# Patient Record
Sex: Female | Born: 1960 | Race: White | Hispanic: No | Smoking: Current every day smoker
Health system: Southern US, Community
[De-identification: ages and names within clinical notes are randomized; demographics above are authoritative.]

## PROBLEM LIST (undated history)

## (undated) HISTORY — PX: TONSILLECTOMY: SUR1361

## (undated) HISTORY — PX: PARTIAL HYSTERECTOMY: SHX80

## (undated) HISTORY — PX: EYE SURGERY: SHX253

---

## 2008-05-01 ENCOUNTER — Emergency Department (HOSPITAL_COMMUNITY): Admission: EM | Admit: 2008-05-01 | Discharge: 2008-05-01 | Payer: Self-pay | Admitting: Family Medicine

## 2008-11-10 ENCOUNTER — Emergency Department (HOSPITAL_COMMUNITY): Admission: EM | Admit: 2008-11-10 | Discharge: 2008-11-10 | Payer: Self-pay | Admitting: Family Medicine

## 2009-10-03 ENCOUNTER — Emergency Department (HOSPITAL_COMMUNITY): Admission: EM | Admit: 2009-10-03 | Discharge: 2009-10-03 | Payer: Self-pay | Admitting: Emergency Medicine

## 2010-06-18 LAB — POCT URINALYSIS DIP (DEVICE)
Ketones, ur: NEGATIVE mg/dL
Nitrite: NEGATIVE
pH: 7 (ref 5.0–8.0)

## 2010-06-18 LAB — URINE CULTURE
Colony Count: NO GROWTH
Culture: NO GROWTH

## 2010-06-18 LAB — GC/CHLAMYDIA PROBE AMP, GENITAL
Chlamydia, DNA Probe: NEGATIVE
GC Probe Amp, Genital: NEGATIVE

## 2010-06-18 LAB — WET PREP, GENITAL: Clue Cells Wet Prep HPF POC: NONE SEEN

## 2010-06-28 LAB — POCT I-STAT, CHEM 8
BUN: 12 mg/dL (ref 6–23)
Calcium, Ion: 1.21 mmol/L (ref 1.12–1.32)
Chloride: 102 mEq/L (ref 96–112)
Creatinine, Ser: 0.6 mg/dL (ref 0.4–1.2)
Glucose, Bld: 98 mg/dL (ref 70–99)
Hemoglobin: 15 g/dL (ref 12.0–15.0)
Potassium: 4.3 mEq/L (ref 3.5–5.1)
TCO2: 28 mmol/L (ref 0–100)

## 2010-06-28 LAB — URINE CULTURE

## 2010-06-28 LAB — POCT URINALYSIS DIP (DEVICE)
Bilirubin Urine: NEGATIVE
Specific Gravity, Urine: 1.02 (ref 1.005–1.030)
Urobilinogen, UA: 0.2 mg/dL (ref 0.0–1.0)

## 2010-06-28 LAB — TSH: TSH: 1.63 u[IU]/mL (ref 0.350–4.500)

## 2010-06-28 LAB — GC/CHLAMYDIA PROBE AMP, GENITAL: Chlamydia, DNA Probe: NEGATIVE

## 2010-06-28 LAB — WET PREP, GENITAL: Trich, Wet Prep: NONE SEEN

## 2011-03-12 ENCOUNTER — Emergency Department (HOSPITAL_COMMUNITY)
Admission: EM | Admit: 2011-03-12 | Discharge: 2011-03-12 | Disposition: A | Discharge status: Home / Self Care | Payer: BC Managed Care – PPO | Attending: Emergency Medicine | Admitting: Emergency Medicine

## 2011-03-12 ENCOUNTER — Emergency Department (HOSPITAL_COMMUNITY): Payer: BC Managed Care – PPO

## 2011-03-12 ENCOUNTER — Encounter: Payer: Self-pay | Admitting: *Deleted

## 2011-03-12 DIAGNOSIS — Z9889 Other specified postprocedural states: Secondary | ICD-10-CM | POA: Insufficient documentation

## 2011-03-12 DIAGNOSIS — IMO0002 Reserved for concepts with insufficient information to code with codable children: Secondary | ICD-10-CM | POA: Insufficient documentation

## 2011-03-12 DIAGNOSIS — R109 Unspecified abdominal pain: Secondary | ICD-10-CM | POA: Insufficient documentation

## 2011-03-12 DIAGNOSIS — R10819 Abdominal tenderness, unspecified site: Secondary | ICD-10-CM | POA: Insufficient documentation

## 2011-03-12 DIAGNOSIS — N949 Unspecified condition associated with female genital organs and menstrual cycle: Secondary | ICD-10-CM | POA: Insufficient documentation

## 2011-03-12 DIAGNOSIS — F17200 Nicotine dependence, unspecified, uncomplicated: Secondary | ICD-10-CM | POA: Insufficient documentation

## 2011-03-12 LAB — CBC
Hemoglobin: 13.3 g/dL (ref 12.0–15.0)
MCH: 29.4 pg (ref 26.0–34.0)
MCHC: 33.7 g/dL (ref 30.0–36.0)
MCV: 87.2 fL (ref 78.0–100.0)
Platelets: 218 10*3/uL (ref 150–400)
RBC: 4.53 MIL/uL (ref 3.87–5.11)

## 2011-03-12 LAB — DIFFERENTIAL
Eosinophils Absolute: 0.3 10*3/uL (ref 0.0–0.7)
Monocytes Absolute: 0.6 10*3/uL (ref 0.1–1.0)

## 2011-03-12 LAB — URINE MICROSCOPIC-ADD ON

## 2011-03-12 LAB — URINALYSIS, ROUTINE W REFLEX MICROSCOPIC
Bilirubin Urine: NEGATIVE
Nitrite: POSITIVE — AB
Specific Gravity, Urine: 1.026 (ref 1.005–1.030)
pH: 6 (ref 5.0–8.0)

## 2011-03-12 LAB — BASIC METABOLIC PANEL
CO2: 27 mEq/L (ref 19–32)
Creatinine, Ser: 0.51 mg/dL (ref 0.50–1.10)
GFR calc Af Amer: >90 mL/min (ref >90)

## 2011-03-12 LAB — WET PREP, GENITAL: Clue Cells Wet Prep HPF POC: NONE SEEN

## 2011-03-12 NOTE — ED Notes (Signed)
Pt to ultrasound

## 2011-03-12 NOTE — ED Provider Notes (Signed)
History     CSN: [id number]  Arrival date & time 03/12/11  1450   First MD Initiated Contact with Patient 03/12/11 1537      Chief Complaint  Patient presents with  . Groin Pain    (Consider location/radiation/quality/duration/timing/severity/associated sxs/prior treatment) Patient is a 50 y.o. female presenting with groin pain. The history is provided by the patient.  Groin Pain Pertinent negatives include no chest pain, no abdominal pain, no headaches and no shortness of breath.   patient has had vaginal pain for last 2 years. Worse for the last 4 months. She states the pain is much worse with exertion. She states lubrication has not helped. She states she sometimes comes to tears with. She states she had a partial hysterectomy 20 years ago. She states her periods stopped about 2 years ago. Upon further questioning she's not sure if it was a partial hysterectomy her something to do with her tubes. No vaginal discharge. No nausea vomiting she states no abdominal pain. No weight loss.  History reviewed. No pertinent past medical history.  Past Surgical History  Procedure Date  . Partial hysterectomy   . Tonsillectomy   . Eye surgery   . Cesarean section x2    History reviewed. No pertinent family history.  History  Substance Use Topics  . Smoking status: Current Everyday Smoker -- 0.5 packs/day for 40 years    Types: Cigarettes  . Smokeless tobacco: Never Used  . Alcohol Use: No    OB History    Grav Para Term Preterm Abortions TAB SAB Ect Mult Living                  Review of Systems  Constitutional: Negative for activity change and appetite change.  HENT: Negative for neck stiffness.   Eyes: Negative for pain.  Respiratory: Negative for chest tightness and shortness of breath.   Cardiovascular: Negative for chest pain and leg swelling.  Gastrointestinal: Negative for nausea, vomiting, abdominal pain and diarrhea.  Genitourinary: Positive for vaginal pain and  pelvic pain. Negative for flank pain, vaginal bleeding and vaginal discharge.  Musculoskeletal: Negative for back pain.  Skin: Negative for rash.  Neurological: Negative for weakness, numbness and headaches.  Psychiatric/Behavioral: Negative for behavioral problems.    Allergies  Sulfa antibiotics  Home Medications  No current outpatient prescriptions on file.  BP 115/75  Pulse 74  Temp(Src) 98 F (36.7 C) (Oral)  Resp 18  Ht 5\' 8"  (1.727 m)  Wt 153 lb (69.4 kg)  BMI 23.26 kg/m2  SpO2 97%  Physical Exam  Nursing note and vitals reviewed. Constitutional: She is oriented to person, place, and time. She appears well-developed and well-nourished.  HENT:  Head: Normocephalic and atraumatic.  Eyes: EOM are normal. Pupils are equal, round, and reactive to light.  Neck: Normal range of motion. Neck supple.  Cardiovascular: Normal rate, regular rhythm and normal heart sounds.   No murmur heard. Pulmonary/Chest: Effort normal and breath sounds normal. No respiratory distress. She has no wheezes. She has no rales.  Abdominal: Soft. Bowel sounds are normal. She exhibits no distension and no mass. There is tenderness. There is no rebound and no guarding.       Suprapubic tenderness without clear mass.  Musculoskeletal: Normal range of motion.  Neurological: She is alert and oriented to person, place, and time. No cranial nerve deficit.  Skin: Skin is warm and dry.  Psychiatric: She has a normal mood and affect. Her speech is normal.  ED Course  Procedures (including critical care time)  Labs Reviewed  URINALYSIS, ROUTINE W REFLEX MICROSCOPIC - Abnormal; Notable for the following:    APPearance CLOUDY (*)    Hgb urine dipstick SMALL (*)    Nitrite POSITIVE (*)    All other components within normal limits  WET PREP, GENITAL - Abnormal; Notable for the following:    WBC, Wet Prep HPF POC FEW (*)    All other components within normal limits  URINE MICROSCOPIC-ADD ON - Abnormal;  Notable for the following:    Bacteria, UA MANY (*)    All other components within normal limits  CBC  DIFFERENTIAL  BASIC METABOLIC PANEL  GC/CHLAMYDIA PROBE AMP, GENITAL   US Transvaginal Non-ob  03/12/2011  *RADIOLOGY REPORT*  Clinical Data: Pelvic pain.  TRANSABDOMINAL AND TRANSVAGINAL ULTRASOUND OF PELVIS Technique:  Both transabdominal and transvaginal ultrasound examinations of the pelvis were performed. Transabdominal technique was performed for global imaging of the pelvis including uterus, ovaries, adnexal regions, and pelvic cul-de-sac.  Comparison: None.   It was necessary to proceed with endovaginal exam following the transabdominal exam to visualize the uterus and ovaries.  Findings:  Uterus: Uterus measures approximately 6.4 x 2.9 x 3.9 cm and has a normal appearance without evidence of mass.  Endometrium: Endometrium is homogeneous and normal in thickness, measuring 3 mm.  Right ovary:  The right ovary was difficult to visualize due to surrounding bowel gas.  A structure is measured which may represent a small right ovary, measuring 1.6 x 1.1 x 1.3 cm.  Left ovary: The left ovary was better visualized and is normal in appearance, measuring 2.2 x 1.2 x 1.6 cm.  Other findings: No free fluid identified.  IMPRESSION: Normal study. No evidence of pelvic mass or other significant abnormality.  Original Report Authenticated By: Reola Calkins, M.D.   US Pelvis Complete  03/12/2011  *RADIOLOGY REPORT*  Clinical Data: Pelvic pain.  TRANSABDOMINAL AND TRANSVAGINAL ULTRASOUND OF PELVIS Technique:  Both transabdominal and transvaginal ultrasound examinations of the pelvis were performed. Transabdominal technique was performed for global imaging of the pelvis including uterus, ovaries, adnexal regions, and pelvic cul-de-sac.  Comparison: None.   It was necessary to proceed with endovaginal exam following the transabdominal exam to visualize the uterus and ovaries.  Findings:  Uterus: Uterus  measures approximately 6.4 x 2.9 x 3.9 cm and has a normal appearance without evidence of mass.  Endometrium: Endometrium is homogeneous and normal in thickness, measuring 3 mm.  Right ovary:  The right ovary was difficult to visualize due to surrounding bowel gas.  A structure is measured which may represent a small right ovary, measuring 1.6 x 1.1 x 1.3 cm.  Left ovary: The left ovary was better visualized and is normal in appearance, measuring 2.2 x 1.2 x 1.6 cm.  Other findings: No free fluid identified.  IMPRESSION: Normal study. No evidence of pelvic mass or other significant abnormality.  Original Report Authenticated By: Reola Calkins, M.D.     1. Dyspareunia       MDM  Patient is in pain with sex where she last 4 months. No dysuria or discharge. She states she had a hysterectomy blood the history is somewhat questionable. Ultrasound was done and did show a uterus present. She has recently gone through menopause. Lab work is reassuring. She'll be discharged home with GYN followup.        Juliet Rude. Rubin Payor, MD 03/12/11 2035

## 2011-03-12 NOTE — ED Notes (Signed)
Pt c/o vagina/pelvic pain x 4 months.  States does not have PCP.

## 2011-03-12 NOTE — ED Notes (Signed)
Patient resting and watching TV with NAD at this time.  

## 2011-03-12 NOTE — ED Notes (Signed)
Patient states she started having pain in her vaginal area x 4 months ago and x2 days has started to have shooting pain in the vaginal area and abdominal tenderness and exhausted all the time and fells dizzy. Patient denies discharge, burning or pain when urinating. Patient states she has no interest or desire in sex because of the pain. Patient denies N/V or fever.

## 2011-03-12 NOTE — ED Notes (Signed)
Patient states she is having shooting pains in to pelvic area x 2 days. Patient denies discharge, burning or pain when urinating. Patient states since the shooting pains started x 2 days ago she has noted her abdomen fells extended.

## 2011-03-13 LAB — GC/CHLAMYDIA PROBE AMP, GENITAL: GC Probe Amp, Genital: NEGATIVE

## 2013-04-08 ENCOUNTER — Emergency Department (HOSPITAL_COMMUNITY)
Admission: EM | Admit: 2013-04-08 | Discharge: 2013-04-08 | Disposition: A | Discharge status: Home / Self Care | Payer: Self-pay | Attending: Emergency Medicine | Admitting: Emergency Medicine

## 2013-04-08 ENCOUNTER — Emergency Department (HOSPITAL_COMMUNITY): Payer: Self-pay

## 2013-04-08 ENCOUNTER — Encounter (HOSPITAL_COMMUNITY): Payer: Self-pay | Admitting: Emergency Medicine

## 2013-04-08 DIAGNOSIS — R51 Headache: Secondary | ICD-10-CM | POA: Insufficient documentation

## 2013-04-08 DIAGNOSIS — F17200 Nicotine dependence, unspecified, uncomplicated: Secondary | ICD-10-CM | POA: Insufficient documentation

## 2013-04-08 DIAGNOSIS — R350 Frequency of micturition: Secondary | ICD-10-CM | POA: Insufficient documentation

## 2013-04-08 DIAGNOSIS — J069 Acute upper respiratory infection, unspecified: Secondary | ICD-10-CM | POA: Insufficient documentation

## 2013-04-08 DIAGNOSIS — N390 Urinary tract infection, site not specified: Secondary | ICD-10-CM | POA: Insufficient documentation

## 2013-04-08 DIAGNOSIS — R55 Syncope and collapse: Secondary | ICD-10-CM | POA: Insufficient documentation

## 2013-04-08 LAB — BASIC METABOLIC PANEL
BUN: 8 mg/dL (ref 6–23)
CALCIUM: 9.1 mg/dL (ref 8.4–10.5)
CO2: 24 mEq/L (ref 19–32)
Chloride: 102 mEq/L (ref 96–112)
Creatinine, Ser: 0.46 mg/dL — ABNORMAL LOW (ref 0.50–1.10)
Glucose, Bld: 95 mg/dL (ref 70–99)
Potassium: 4.6 mEq/L (ref 3.7–5.3)
SODIUM: 139 meq/L (ref 137–147)

## 2013-04-08 LAB — URINALYSIS, ROUTINE W REFLEX MICROSCOPIC
Bilirubin Urine: NEGATIVE
Glucose, UA: NEGATIVE mg/dL
Hgb urine dipstick: NEGATIVE
Ketones, ur: NEGATIVE mg/dL
Nitrite: POSITIVE — AB
PROTEIN: NEGATIVE mg/dL
Specific Gravity, Urine: 1.009 (ref 1.005–1.030)
UROBILINOGEN UA: 1 mg/dL (ref 0.0–1.0)
pH: 7 (ref 5.0–8.0)

## 2013-04-08 LAB — CBC
HCT: 39.2 % (ref 36.0–46.0)
Hemoglobin: 13.2 g/dL (ref 12.0–15.0)
MCH: 29.4 pg (ref 26.0–34.0)
MCHC: 33.7 g/dL (ref 30.0–36.0)
MCV: 87.3 fL (ref 78.0–100.0)
Platelets: 248 10*3/uL (ref 150–400)
RBC: 4.49 MIL/uL (ref 3.87–5.11)
RDW: 13.4 % (ref 11.5–15.5)
WBC: 8.4 10*3/uL (ref 4.0–10.5)

## 2013-04-08 LAB — URINE MICROSCOPIC-ADD ON

## 2013-04-08 LAB — GLUCOSE, CAPILLARY: GLUCOSE-CAPILLARY: 91 mg/dL (ref 70–99)

## 2013-04-08 MED ORDER — SODIUM CHLORIDE 0.9 % IV BOLUS (SEPSIS)
1000.0000 mL | Freq: Once | INTRAVENOUS | Status: AC
  Administered 2013-04-08: 1000 mL via INTRAVENOUS

## 2013-04-08 MED ORDER — CEPHALEXIN 500 MG PO CAPS: 500.0000 mg | ORAL_CAPSULE | Freq: Two times a day (BID) | ORAL | Status: DC

## 2013-04-08 NOTE — ED Provider Notes (Signed)
CSN: [id number]     Arrival date & time 04/08/13  2008 History   First MD Initiated Contact with Patient 04/08/13 2019     Chief Complaint  Patient presents with  . Loss of Consciousness   (Consider location/radiation/quality/duration/timing/severity/associated sxs/prior Treatment) HPI Comments: 53 year old female presents after a syncope episode. Patient states that she has been ill the last couple days with headache, cough, chest congestion. She's not had "high fevers" or shortness of breath. No chest pain. Patient states his headache is throbbing headache at the top of her head. Has been waxing and waning there is always some level of the headache. She states is about 5 or 6/10. Is not come on suddenly and is not the worst headache of her life. No focal weakness or numbness. Patient states that she got off the couch when she woke up her nap and she felt extremely thirsty. She states she's never felt this thirsty. Patient then started to walk to the kitchen and had a syncope episode. She states she did not feel quite right when she got not but has not her feeling particularly lightheaded.. She denies any visual complaints. Her to getting another came home and found her on the floor and she quickly awoke. No shaking noted. Does not know how long she was passed out.    History reviewed. No pertinent past medical history. Past Surgical History  Procedure Laterality Date  . Partial hysterectomy    . Tonsillectomy    . Eye surgery    . Cesarean section  x2   History reviewed. No pertinent family history. History  Substance Use Topics  . Smoking status: Current Every Day Smoker -- 0.50 packs/day for 40 years    Types: Cigarettes  . Smokeless tobacco: Never Used  . Alcohol Use: No   OB History   Grav Para Term Preterm Abortions TAB SAB Ect Mult Living                 Review of Systems  Constitutional: Negative for fever.  HENT: Positive for congestion. Negative for sore throat.   Eyes:  Negative for visual disturbance.  Respiratory: Positive for cough. Negative for shortness of breath.   Cardiovascular: Positive for syncope. Negative for chest pain.  Gastrointestinal: Negative for vomiting, abdominal pain and diarrhea.  Genitourinary: Positive for urgency. Negative for dysuria and frequency.  Musculoskeletal: Negative for back pain and myalgias.  Neurological: Positive for syncope and headaches. Negative for dizziness and weakness.  All other systems reviewed and are negative.    Allergies  Sulfa antibiotics  Home Medications   Current Outpatient Rx  Name  Route  Sig  Dispense  Refill  . acetaminophen (TYLENOL) 325 MG tablet   Oral   Take 650 mg by mouth every 6 (six) hours as needed (pain).          BP 95/75  Temp(Src) 98.7 F (37.1 C) (Oral)  Resp 17  Ht 5\' 8"  (1.727 m)  Wt 163 lb (73.936 kg)  BMI 24.79 kg/m2  SpO2 98% Physical Exam  Nursing note and vitals reviewed. Constitutional: She is oriented to person, place, and time. She appears well-developed and well-nourished.  HENT:  Head: Normocephalic and atraumatic.  Right Ear: External ear normal.  Left Ear: External ear normal.  Nose: Nose normal.  Eyes: EOM are normal. Pupils are equal, round, and reactive to light. Right eye exhibits no discharge. Left eye exhibits no discharge.  Cardiovascular: Normal rate, regular rhythm and normal heart sounds.  No murmur heard. Pulmonary/Chest: Effort normal and breath sounds normal.  Abdominal: Soft. There is no tenderness.  Neurological: She is alert and oriented to person, place, and time. She has normal strength. No cranial nerve deficit or sensory deficit. GCS eye subscore is 4. GCS verbal subscore is 5. GCS motor subscore is 6.  Reflex Scores:      Bicep reflexes are 2+ on the right side and 2+ on the left side.      Patellar reflexes are 2+ on the right side and 2+ on the left side.      Achilles reflexes are 2+ on the right side and 2+ on the left  side. CN 2-12 grossly intact. 5/5 strength in all 4 extremities.  Skin: Skin is warm and dry.    ED Course  Procedures (including critical care time) Labs Review Labs Reviewed  BASIC METABOLIC PANEL - Abnormal; Notable for the following:    Creatinine, Ser 0.46 (*)    All other components within normal limits  URINALYSIS, ROUTINE W REFLEX MICROSCOPIC - Abnormal; Notable for the following:    APPearance CLOUDY (*)    Nitrite POSITIVE (*)    Leukocytes, UA TRACE (*)    All other components within normal limits  URINE MICROSCOPIC-ADD ON - Abnormal; Notable for the following:    Bacteria, UA MANY (*)    All other components within normal limits  GLUCOSE, CAPILLARY  CBC   Imaging Review Dg Chest 2 View  04/08/2013   CLINICAL DATA:  Syncope, cough, smoker  EXAM: CHEST  2 VIEW  COMPARISON:  None.  FINDINGS: The heart size and mediastinal contours are within normal limits. Both lungs are clear. The visualized skeletal structures are unremarkable.  IMPRESSION: No active cardiopulmonary disease.   Electronically Signed   By: Ruel Favorsrevor  Shick M.D.   On: 04/08/2013 22:14    EKG Interpretation    Date/Time:  Tuesday April 08 2013 20:10:12 EST Ventricular Rate:  92 PR Interval:  156 QRS Duration: 80 QT Interval:  343 QTC Calculation: 424 R Axis:   84 Text Interpretation:  Age not entered, assumed to be  53 years old for purpose of ECG interpretation Sinus rhythm Nonspecific T in AVL, artifact noted Otherwise normal ECG No old tracing to compare Confirmed by Irish Piech  MD, Addalynne Golding ([id number]) on 04/08/2013 9:01:26 PM            MDM   1. Syncope   2. URI (upper respiratory infection)   3. UTI (lower urinary tract infection)    Patient's respiratory symptoms are most likely a viral URI. No signs of PNA, no dyspnea. She has had a gradual waxing and waning headache with her URI, but with no neck stiffness, normal neuro exam and no acute worsening of HA I [facility] and feel  this is not the cause of her syncope. She relates she's had significantly decreased PO intake and her symptoms were recreated with orthostatics. She feels improved with fluids. I encouraged increased PO intake. Her EKG shows no worrisome cause for syncope. She does related to having some urinary urgency for the past 2 weeks w/o dysuria. Given the positive nitrites will treat as UTI with keflex. No signs of having pyelonephritis.     Audree CamelScott T Nirvaan Frett, MD 04/08/13 [phone]

## 2013-04-08 NOTE — ED Notes (Addendum)
Pt reports having flu like symptoms x3 days. Pt was found by boyfriend in kitchen floor passed out. Pt unable to remember what happened. Pt in floor for an estimated 1-2hrs. 12 lead unremarkable. Pt also reports feeling extremely thirsty prior to syncopal episode and has had little fluid intake over past couple of days.

## 2013-04-08 NOTE — Discharge Instructions (Signed)
°Emergency Department Resource Guide °1) Find a Doctor and Pay Out of Pocket °Although you won't have to find out who is covered by your insurance plan, it is a good idea to ask around and get recommendations. You will then need to call the office and see if the doctor you have chosen will accept you as a new patient and what types of options they offer for patients who are self-pay. Some doctors offer discounts or will set up payment plans for their patients who do not have insurance, but you will need to ask so you aren't surprised when you get to your appointment. ° °2) Contact Your Local Health Department °Not all health departments have doctors that can see patients for sick visits, but many do, so it is worth a call to see if yours does. If you don't know where your local health department is, you can check in your phone book. The CDC also has a tool to help you locate your state's health department, and many state websites also have listings of all of their local health departments. ° °3) Find a Walk-in Clinic °If your illness is not likely to be very severe or complicated, you may want to try a walk in clinic. These are popping up all over the country in pharmacies, drugstores, and shopping centers. They're usually staffed by nurse practitioners or physician assistants that have been trained to treat common illnesses and complaints. They're usually fairly quick and inexpensive. However, if you have serious medical issues or chronic medical problems, these are probably not your best option. ° °No Primary Care Doctor: °- Call [facility] Connect at  832-8000 - they can help you locate a primary care doctor that  accepts your insurance, provides certain services, etc. °- Physician Referral Service- 1-800-533-3463 ° °Chronic Pain Problems: °Organization         Address  Phone   Notes  ° [facility]  (336) 297-2271 Patients need to be referred by their primary care doctor.  ° °Medication  Assistance: °Organization         Address  Phone   Notes  °[place] [facility] [address] °[place], [place] [address] (336) 641-8030 --Must be a resident of [place] °-- Must have NO insurance coverage whatsoever (no Medicaid/ Medicare, etc.) °-- The pt. MUST have a primary care doctor that directs their care regularly and follows them in the community °  °[facility]  (866) 331-1348   °[employer]  (888) 892-1162   ° °Agencies that provide inexpensive medical care: °Organization         Address  Phone   Notes  °[facility]  (336) 832-8035   °[facility]    (336) 832-7272   °[facility] [address] °[place], [place] [address] (336) 832-4777   °[facility] [address], °[place] (336) 271-4999   °[facility]    (336) 373-0678   °[facility]    (336) 272-1050   °[facility] ° [address], [place] Phone:  (336) 832-4444, Fax:  (336) 832-4440 Hours of Operation:  9 am - 6 pm, M-F.  Also accepts Medicaid/Medicare and self-pay.  °[facility] ° [address], [place] Phone: (336) 832-3150, Fax: (336) 832-3151. Hours of Operation:  8:30 am - 5:30 pm, M-F.  Also accepts Medicaid and self-pay.  °[facility] [place] [address], [place] Phone: (336) 878-6027   °[facility] [address], [place], [place] (336)723-1848, Ext. 123 Mondays & Thursdays: 7-9 AM.  First 15 patients are seen on a first come, first serve basis. °  ° °Medicaid-accepting [place] Providers: ° °Organization         Address  Phone   Notes  °[facility] [address], [place] (336) 641-2100 Also accepts self-pay patients.  °[facility] [address], [place] ° (336) 856-9996   °[facility] [address] [place] [address], [place]  (336) 288-8857   °[facility] [address] [place] [address], [place] (336) 299-7000   °Veita Bland [address], [place]  ° (336) 373-1557 Only accepts [place] Access Medicaid patients after they have their name applied to their card.  ° °Self-Pay (no insurance) in [place]: ° °Organization         Address  Phone   Notes  °Sickle Cell Patients, [facility] [address], [place] (336) 832-1970   °[facility] [address], [place] (336) 832-4400   °[facility] [place] [facility] ° [address], [place] (336) 992-4800   °[facility]/Dr. Osei-Bonsu ° [address] [place] [address], [place] or [address], [place] (336) 841-8500 Phone number for both [place] and [place] locations is the same.  °[facility] [address], [place] (336) 299-0000   °[facility] [address] [place] [address], [place] or [address] (336) 852-7530 °(336) 878-2260   °[facility] [address], [place] (336) 350-1642, phone; (336) 294-5005, fax Sees patients 1st and 3rd Saturday of every month.  Must not qualify for public or private insurance (i.e. Medicaid, Medicare, [place] Health Choice, Veterans' Benefits) • Household income should be no more than 200% of the poverty level •The clinic cannot treat you if you are pregnant or think you are pregnant • Sexually transmitted diseases are not treated at the clinic.  ° ° °Dental Care: °Organization         Address  Phone  Notes  °[place] [facility] [address], [place] (336) 641-6152 Accepts children up to age 21 who are enrolled in Medicaid or [place] Health Choice; pregnant women with a Medicaid card; and children who have applied for Medicaid or [place] Health Choice, but were declined, whose parents can pay a reduced fee at time of service.  °[place]  [facility] [place]  [address], [place] (336) 641-7733 Accepts children up to age 21 who are enrolled in Medicaid or [place] Health Choice; pregnant women with a Medicaid card; and children who have applied for Medicaid or [place] Health Choice, but were declined, whose parents can pay a reduced fee at time of service.  °[facility] ° [address], [place] (336) 641-4533 Patients are seen by appointment only. Walk-ins are not accepted. [facility] will see patients 18 years of age and older. °Monday - Tuesday (8am-5pm) °Most Wednesdays (8:30-5pm) °$30 per visit, cash only  °[facility] ° [address], [place] (336) 641-4533 Patients are seen by appointment only. Walk-ins are not accepted. [facility] will see patients 18 years of age and older. °One   Wednesday Evening (Monthly: Volunteer Based).  $30 per visit, cash only  °[facility]  (919) 537-3737 for adults; Children under age 4, call Graduate Pediatric Dentistry at (919) [phone]. Children aged 4-14, please call (919) 537-3737 to request a pediatric application. ° Dental services are provided in all areas of dental care including fillings, crowns and bridges, complete and partial dentures, implants, gum treatment, root canals, and extractions. Preventive care is also provided. Treatment is provided to both adults and children. °Patients are selected via a lottery and there is often a waiting list. °  °[facility] [address], °[place] ° (336) 763-8833 www.drcivils.com °  °[facility] [address], [place], [place] (336)723-1848, Ext. 123 Second and Fourth Thursday of each month, opens at 6:30 AM; Clinic ends at 9 AM.  Patients are seen on a first-come first-served basis, and a limited number are seen during each clinic.  ° °[facility] ° [address], [place], [place] (336) 723-7904    Eligibility Requirements °You must have lived in [place], or [place] counties for at least the last three months. °  You cannot be eligible for state or federal sponsored healthcare insurance, including Veterans Administration, Medicaid, or Medicare. °  You generally cannot be eligible for healthcare insurance through your employer.  °  How to apply: °Eligibility screenings are held every Tuesday and Wednesday afternoon from 1:00 pm until 4:00 pm. You do not need an appointment for the interview!  °[facility] [address], [place], [place] 336-631-2330   °[facility]  336-342-8273   °[facility]  336-703-3100   °[facility]  336-570-6415   ° °Behavioral Health Resources in the Community: °Intensive Outpatient Programs °Organization         Address  Phone  Notes  °[facility] [address], [place], [place] 336-878-6098   °[facility] [address], [place], [place] 336-832-9800   °[facility] [address], [place], [place] ° 336-882-2125   °[facility] [address],  °[place], [place] 1-800-853-5163 or 336-641-4981   °Substance Abuse Resources °Organization         Address  Phone  Notes  °[facility]  336-882-2125   °[facility]  336-784-9470   °[facility]  336-285-9073   °[facility]  336-845-3988   °Residential & Outpatient Substance Abuse Program  1-800-659-3381   °Psychological Services °Organization         Address  Phone  Notes  °[facility]  336- 832-9600   °[facility]  336- 378-7881   °[facility] [address], [place] 1-800-853-5163 or 336-641-4981   ° °Mobile Crisis Teams °Organization         Address  Phone  Notes  °[facility], Mobile Crisis Care Unit  1-877-626-1772   °Assertive °[facility] ° [address]  [place], [place] 336-834-9664   °Sharon DeEsch [address] °[place] 336-554-5454   ° °Self-Help/Support Groups °Organization         Address  Phone             Notes  °[facility]  - variety of support groups  336- 373-1402 Call for more information  °Narcotics Anonymous (NA), [facility] [address], °[place]  2 meetings at this location  ° °  Residential Treatment Programs °Organization         Address  Phone  Notes  °[facility] [address],    °[place]  1-866-801-8205   °[facility] ° [address], [place], [place] 704-293-8524   °[facility] [address], [place] 336-845-3988 Admissions: 8am-3pm M-F  °[facility] [address]    °[place], [place] 336-841-1104   °[facility] [address], [place], [place] 336-379-7146   °[facility] [address]  °[place], [place] 336-285-9073   °[facility] - Intensive Outpatient [address], [place], [place] 336-852-3033   °[facility] [address]  °[place], [place] 1-877-615-2722 or 336-784-9470   °[facility] ([facility] [address] , [place] 336-227-7417 Accepts Medicaid  °[facility] [address]  °[place] 1-800-659-3381 Substance Abuse/Addiction Treatment  ° °[place] Behavioral Health Resources °Organization         Address  Phone  Notes  °[facility]  (888) 581-9988   °Julie Brannon, PhD [address], [place]   (336) 349-5553 or (336) 951-0000   °[facility]   [address] °[place], [place] (336) 349-4454   °[facility] [address], [place], [place] (336) 342-8316 Insurance/Medicaid/sponsorship through [facility]  °[facility] [address]                                    [place], [place] (336) 342-8316 Therapy/tele-psych/case    °[facility] [address]  ° [place], [place] (336) 349-2233    °Dr. Arfeen  (336) 349-4544   °[facility] [place]  [employer] [place] [facility] 1) [address], [place] °2) [address], [place] °3)  [address], [place] (336) 349-3220 °(336) 342-7768 ° °(336) 342-8140   °[place] [facility] (336) 342-1394 or (336) 342-3537 (After Hours)    ° ° °

## 2013-04-08 NOTE — ED Notes (Signed)
Bed: [id number] Expected date:  Expected time:  Means of arrival:  Comments: EMS 53yo syncope, flu like sx

## 2013-04-09 NOTE — Progress Notes (Signed)
   CARE MANAGEMENT ED NOTE 04/09/2013  Patient:  Jennifer Wallace,Jennifer Wallace   Account Number:  [id number]  Date Initiated:  04/09/2013  Documentation initiated by:  Radford PaxFERRERO,Malcome Ambrocio  Subjective/Objective Assessment:   Patient presents to Ed with syncopal episode.     Subjective/Objective Assessment Detail:     Action/Plan:   Action/Plan Detail:   Anticipated DC Date:       Status Recommendation to Physician:   Result of Recommendation:    Other ED Services  Consult Working Plan    DC Planning Services  Other  PCP issues    Choice offered to / List presented to:            Status of service:  Completed, signed off  ED Comments:   ED Comments Detail:  EDCM spoke to patient and her spouse at bedside.  As per patient, she is currently applying for [facility].  [place].  [facility] provided [facility] with a list of pcps who accept self pay patients, list of discount pharmacies and website needymeds.org for [facility], list of financial resources in the community such as local churches and salvation army, urban ministries, information regarding Medicaid and [employer]ordable Care Act, dental assistance for uninsured patients, and phone to inquire about the orange card.  Patient agreebale to receive letter about orange card services in the mail.  Patient thankful for services.  No further CM needs at this time.

## 2013-04-10 LAB — URINE CULTURE: Colony Count: >=100000

## 2013-04-11 NOTE — ED Notes (Signed)
+   Urine Patient treated with Cephalexin-sensitive to same-chart appended per protocol MD,

## 2013-10-27 ENCOUNTER — Emergency Department (HOSPITAL_COMMUNITY)
Admission: EM | Admit: 2013-10-27 | Discharge: 2013-10-27 | Discharge status: Against Medical Advice | Payer: BC Managed Care – PPO | Attending: Emergency Medicine | Admitting: Emergency Medicine

## 2013-10-27 ENCOUNTER — Encounter (HOSPITAL_COMMUNITY): Payer: Self-pay | Admitting: Emergency Medicine

## 2013-10-27 DIAGNOSIS — R1031 Right lower quadrant pain: Secondary | ICD-10-CM | POA: Insufficient documentation

## 2013-10-27 DIAGNOSIS — R110 Nausea: Secondary | ICD-10-CM | POA: Insufficient documentation

## 2013-10-27 DIAGNOSIS — F17200 Nicotine dependence, unspecified, uncomplicated: Secondary | ICD-10-CM | POA: Insufficient documentation

## 2013-10-27 NOTE — ED Notes (Signed)
Pt told registration that she was leaving.  

## 2013-10-27 NOTE — ED Notes (Signed)
Pt reports abd pain today, more severe RLQ and radiates into mid abd. Having nausea but denies any vomiting, diarrhea, vaginal or urinary symptoms.

## 2014-08-10 ENCOUNTER — Emergency Department (HOSPITAL_COMMUNITY): Payer: Self-pay

## 2014-08-10 ENCOUNTER — Emergency Department (HOSPITAL_COMMUNITY)
Admission: EM | Admit: 2014-08-10 | Discharge: 2014-08-10 | Disposition: A | Discharge status: Home / Self Care | Payer: Self-pay | Attending: Emergency Medicine | Admitting: Emergency Medicine

## 2014-08-10 ENCOUNTER — Encounter (HOSPITAL_COMMUNITY): Payer: Self-pay | Admitting: Emergency Medicine

## 2014-08-10 DIAGNOSIS — J441 Chronic obstructive pulmonary disease with (acute) exacerbation: Secondary | ICD-10-CM | POA: Insufficient documentation

## 2014-08-10 DIAGNOSIS — Z720 Tobacco use: Secondary | ICD-10-CM | POA: Insufficient documentation

## 2014-08-10 LAB — BASIC METABOLIC PANEL
Anion gap: 5 (ref 5–15)
BUN: 8 mg/dL (ref 6–20)
CALCIUM: 9.1 mg/dL (ref 8.9–10.3)
CO2: 27 mmol/L (ref 22–32)
Chloride: 108 mmol/L (ref 101–111)
Creatinine, Ser: 0.73 mg/dL (ref 0.44–1.00)
GFR calc Af Amer: >60 mL/min (ref >60)
GFR calc non Af Amer: >60 mL/min (ref >60)
Glucose, Bld: 104 mg/dL — ABNORMAL HIGH (ref 65–99)
Potassium: 5 mmol/L (ref 3.5–5.1)
Sodium: 140 mmol/L (ref 135–145)

## 2014-08-10 LAB — CBC WITH DIFFERENTIAL/PLATELET
BASOS PCT: 1 % (ref 0–1)
Basophils Absolute: 0 10*3/uL (ref 0.0–0.1)
EOS ABS: 0.2 10*3/uL (ref 0.0–0.7)
Eosinophils Relative: 3 % (ref 0–5)
HEMATOCRIT: 42.3 % (ref 36.0–46.0)
Hemoglobin: 13.9 g/dL (ref 12.0–15.0)
LYMPHS ABS: 1.9 10*3/uL (ref 0.7–4.0)
LYMPHS PCT: 34 % (ref 12–46)
MCH: 29 pg (ref 26.0–34.0)
MCHC: 32.9 g/dL (ref 30.0–36.0)
MCV: 88.1 fL (ref 78.0–100.0)
MONOS PCT: 11 % (ref 3–12)
Monocytes Absolute: 0.6 10*3/uL (ref 0.1–1.0)
Neutro Abs: 2.9 10*3/uL (ref 1.7–7.7)
Neutrophils Relative %: 51 % (ref 43–77)
Platelets: 218 10*3/uL (ref 150–400)
RBC: 4.8 MIL/uL (ref 3.87–5.11)
RDW: 14.2 % (ref 11.5–15.5)
WBC: 5.7 10*3/uL (ref 4.0–10.5)

## 2014-08-10 MED ORDER — AEROCHAMBER Z-STAT PLUS/MEDIUM MISC
1.0000 | Freq: Once | Status: AC
  Filled 2014-08-10: qty 1

## 2014-08-10 MED ORDER — PREDNISONE 20 MG PO TABS: 20.0000 mg | ORAL_TABLET | Freq: Two times a day (BID) | ORAL | Status: DC

## 2014-08-10 MED ORDER — ALBUTEROL SULFATE HFA 108 (90 BASE) MCG/ACT IN AERS
2.0000 | INHALATION_SPRAY | Freq: Once | RESPIRATORY_TRACT | Status: AC
  Administered 2014-08-10: 2 via RESPIRATORY_TRACT
  Filled 2014-08-10: qty 6.7

## 2014-08-10 MED ORDER — AEROCHAMBER PLUS FLO-VU SMALL MISC
1.0000 | Freq: Once | Status: AC
  Administered 2014-08-10: 1
  Filled 2014-08-10: qty 1

## 2014-08-10 MED ORDER — PREDNISONE 20 MG PO TABS
60.0000 mg | ORAL_TABLET | Freq: Once | ORAL | Status: AC
  Administered 2014-08-10: 60 mg via ORAL
  Filled 2014-08-10: qty 3

## 2014-08-10 MED ORDER — ALBUTEROL (5 MG/ML) CONTINUOUS INHALATION SOLN
10.0000 mg/h | INHALATION_SOLUTION | Freq: Once | RESPIRATORY_TRACT | Status: AC
  Administered 2014-08-10: 10 mg/h via RESPIRATORY_TRACT
  Filled 2014-08-10: qty 20

## 2014-08-10 MED ORDER — ALBUTEROL SULFATE HFA 108 (90 BASE) MCG/ACT IN AERS
2.0000 | INHALATION_SPRAY | Freq: Once | RESPIRATORY_TRACT | Status: AC
Start: 2014-08-10 — End: 2014-08-10

## 2014-08-10 NOTE — ED Notes (Signed)
Patient coming from home with c/o of SOB and cough ongoing since Saturday morning.  The cough is a dry, nonproductive cough.

## 2014-08-10 NOTE — ED Provider Notes (Signed)
CSN: [id number]     Arrival date & time 08/10/14  0704 History   First MD Initiated Contact with Patient 08/10/14 [phone]     Chief Complaint  Patient presents with  . Cough  . Shortness of Breath     (Consider location/radiation/quality/duration/timing/severity/associated sxs/prior Treatment) HPI   Jennifer Wallace is a 54 y.o. female who presents for evaluation of cough, nonproductive, shortness of breath, and occasional episodes of weakness. Symptom is ongoing for several months and worsening over the last several days. She is currently smoking, and has a long-time history of tobacco abuse. No chronic respiratory difficulty that is known, or previous similar problems. She denies chest pain, nausea, vomiting, abdominal pain or back pain. There are no other known modifying factors.  History reviewed. No pertinent past medical history. Past Surgical History  Procedure Laterality Date  . Partial hysterectomy    . Tonsillectomy    . Eye surgery    . Cesarean section  x2   No family history on file. History  Substance Use Topics  . Smoking status: Current Every Day Smoker -- 0.50 packs/day for 40 years    Types: Cigarettes  . Smokeless tobacco: Never Used  . Alcohol Use: No   OB History    No data available     Review of Systems  All other systems reviewed and are negative.     Allergies  Sulfa antibiotics  Home Medications   Prior to Admission medications   Medication Sig Start Date End Date Taking? Authorizing Provider  acetaminophen (TYLENOL) 325 MG tablet Take 650 mg by mouth every 6 (six) hours as needed (pain).   Yes Historical Provider, MD  predniSONE (DELTASONE) 20 MG tablet Take 1 tablet (20 mg total) by mouth 2 (two) times daily. 08/10/14   Mancel BaleElliott Pearlie Lafosse, MD   BP 117/60 mmHg  Pulse 92  Temp(Src) 98.3 F (36.8 C) (Oral)  Resp 22  Ht 5\' 8"  (1.727 m)  Wt 181 lb (82.101 kg)  BMI 27.53 kg/m2  SpO2 93% Physical Exam  Constitutional: She is oriented to person,  place, and time. She appears well-developed and well-nourished.  HENT:  Head: Normocephalic and atraumatic.  Right Ear: External ear normal.  Left Ear: External ear normal.  Eyes: Conjunctivae and EOM are normal. Pupils are equal, round, and reactive to light.  Neck: Normal range of motion and phonation normal. Neck supple.  Cardiovascular: Normal rate, regular rhythm and normal heart sounds.   Pulmonary/Chest: Effort normal and breath sounds normal. No respiratory distress. She exhibits no bony tenderness.  Decreased air movement bilaterally with generalized rhonchi and wheezing  Abdominal: Soft. There is no tenderness.  Musculoskeletal: Normal range of motion.  Neurological: She is alert and oriented to person, place, and time. No cranial nerve deficit or sensory deficit. She exhibits normal muscle tone. Coordination normal.  Skin: Skin is warm, dry and intact.  Psychiatric: She has a normal mood and affect. Her behavior is normal. Judgment and thought content normal.  Nursing note and vitals reviewed.   ED Course  Procedures (including critical care time)  Medications  predniSONE (DELTASONE) tablet 60 mg (60 mg Oral Given 08/10/14 0808)  albuterol (PROVENTIL,VENTOLIN) solution continuous neb (10 mg/hr Nebulization Given 08/10/14 0741)  albuterol (PROVENTIL HFA;VENTOLIN HFA) 108 (90 BASE) MCG/ACT inhaler 2 puff (2 puffs Inhalation Given 08/10/14 1052)  AEROCHAMBER PLUS FLO-VU SMALL device MISC 1 each (1 each Other Given 08/10/14 1052)  aerochamber Z-Stat Plus/medium 1 each (0 each Other Duplicate 08/10/14 1052)  albuterol (PROVENTIL HFA;VENTOLIN HFA) 108 (90 BASE) MCG/ACT inhaler 2 puff (0 puffs Inhalation Duplicate 08/10/14 1054)    Patient Vitals for the past 24 hrs:  BP Temp Temp src Pulse Resp SpO2 Height Weight  08/10/14 1100 117/60 mmHg - - 92 - 93 % - -  08/10/14 1054 - - - - - 98 % - -  08/10/14 1030 (!) 109/52 mmHg - - 84 22 - - -  08/10/14 1015 105/55 mmHg - - 87 25 92 % - -   08/10/14 0945 103/74 mmHg - - 90 25 94 % - -  08/10/14 0939 125/55 mmHg 98.3 F (36.8 C) Oral 99 25 99 % - -  08/10/14 0845 115/57 mmHg - - 88 21 98 % - -  08/10/14 0830 118/61 mmHg - - 82 23 96 % - -  08/10/14 0815 118/63 mmHg - - 81 17 97 % - -  08/10/14 0800 114/64 mmHg - - 73 14 96 % - -  08/10/14 0745 122/69 mmHg - - 79 21 96 % - -  08/10/14 0743 - - - - - 98 % - -  08/10/14 0730 121/70 mmHg - - 90 22 99 % - -  08/10/14 0720 140/88 mmHg 98.1 F (36.7 C) Oral 80 21 97 %  (1.727 m) 181 lb (82.101 kg)    10:34 AM Reevaluation with update and discussion. After initial assessment and treatment, an updated evaluation reveals she feels better after this treatment. Findings discussed with patient and significant other, all questions answered. She was encouraged to stop smoking. Asaiah Hunnicutt L    Labs Review Labs Reviewed  BASIC METABOLIC PANEL - Abnormal; Notable for the following:    Glucose, Bld 104 (*)    All other components within normal limits  CBC WITH DIFFERENTIAL/PLATELET    Imaging Review Dg Chest 2 View  08/10/2014   CLINICAL DATA:  Shortness of breath, cough starting Saturday  EXAM: CHEST  2 VIEW  COMPARISON:  04/08/2013  FINDINGS: Cardiomediastinal silhouette is stable. No acute infiltrate or pleural effusion. No pulmonary edema. Bony thorax is unremarkable.  IMPRESSION: No active cardiopulmonary disease.   Electronically Signed   By: Natasha Mead M.D.   On: 08/10/2014 09:04     EKG Interpretation None      MDM   Final diagnoses:  COPD exacerbation  Tobacco abuse    Evaluation is consistent with COPD exacerbation. No evidence for pneumonia. Doubt serous bacterial infection, metabolic instability or impending vascular collapse.  Nursing Notes Reviewed/ Care Coordinated Applicable Imaging Reviewed Interpretation of Laboratory Data incorporated into ED treatment  The patient appears reasonably screened and/or stabilized for discharge and I doubt any other  medical condition or other [facility] requiring further screening, evaluation, or treatment in the ED at this time prior to discharge.  Plan: Home Medications- Albuterol, Prednisone; Home Treatments- rest; return here if the recommended treatment, does not improve the symptoms; Recommended follow up- PCP prn     Mancel Bale, MD 08/10/14 [phone]

## 2014-08-10 NOTE — ED Notes (Signed)
Patient ambulated in the hall way with stable ambulation, maintaining 100% and a pulse between 100 - 110.

## 2014-08-10 NOTE — Discharge Instructions (Signed)
Use inhaler 2 puffs every 4 hours as needed for cough or trouble breathing Start the prednisone prescription, tomorrow Try to stop smoking. Follow-up with a primary care doctor for a checkup in one or 2 weeks.     Chronic Obstructive Pulmonary Disease Exacerbation Chronic obstructive pulmonary disease (COPD) is a common lung condition in which airflow from the lungs is limited. COPD is a general term that can be used to describe many different lung problems that limit airflow, including chronic bronchitis and emphysema. COPD exacerbations are episodes when breathing symptoms become much worse and require extra treatment. Without treatment, COPD exacerbations can be life threatening, and frequent COPD exacerbations can cause further damage to your lungs. CAUSES   Respiratory infections.   Exposure to smoke.   Exposure to air pollution, chemical fumes, or dust. Sometimes there is no apparent cause or trigger. RISK FACTORS  Smoking cigarettes.  Older age.  Frequent prior COPD exacerbations. SIGNS AND SYMPTOMS   Increased coughing.   Increased thick spit (sputum) production.   Increased wheezing.   Increased shortness of breath.   Rapid breathing.   Chest tightness. DIAGNOSIS  Your medical history, a physical exam, and tests will help your health care provider make a diagnosis. Tests may include:  A chest X-ray.  Basic lab tests.  Sputum testing.  An arterial blood gas test. TREATMENT  Depending on the severity of your COPD exacerbation, you may need to be admitted to a hospital for treatment. Some of the treatments commonly used to treat COPD exacerbations are:   Antibiotic medicines.   Bronchodilators. These are drugs that expand the air passages. They may be given with an inhaler or nebulizer. Spacer devices may be needed to help improve drug delivery.  Corticosteroid medicines.  Supplemental oxygen therapy.  HOME CARE INSTRUCTIONS   Do not smoke.  Quitting smoking is very important to prevent COPD from getting worse and exacerbations from happening as often.  Avoid exposure to all substances that irritate the airway, especially to tobacco smoke.   If you were prescribed an antibiotic medicine, finish it all even if you start to feel better.  Take all medicines as directed by your health care provider.It is important to use correct technique with inhaled medicines.  Drink enough fluids to keep your urine clear or pale yellow (unless you have a medical condition that requires fluid restriction).  Use a cool mist vaporizer. This makes it easier to clear your chest when you cough.   If you have a home nebulizer and oxygen, continue to use them as directed.   Maintain all necessary vaccinations to prevent infections.   Exercise regularly.   Eat a healthy diet.   Keep all follow-up appointments as directed by your health care provider. SEEK IMMEDIATE MEDICAL CARE IF:  You have worsening shortness of breath.   You have trouble talking.   You have severe chest pain.  You have blood in your sputum.  You have a fever.  You have weakness, vomit repeatedly, or faint.   You feel confused.   You continue to get worse. MAKE SURE YOU:   Understand these instructions.  Will watch your condition.  Will get help right away if you are not doing well or get worse. Document Released: 12/25/2006 Document Revised: 07/14/2013 Document Reviewed: 11/01/2012 [facility] Patient Information 2015 [place]. This information is not intended to replace advice given to you by your health care provider. Make sure you discuss any questions you have with your health care  provider.  Smoking Cessation Quitting smoking is important to your health and has many advantages. However, it is not always easy to quit since nicotine is a very addictive drug. Oftentimes, people try 3 times or more before being able to quit. This document  explains the best ways for you to prepare to quit smoking. Quitting takes hard work and a lot of effort, but you can do it. ADVANTAGES OF QUITTING SMOKING  You will live longer, feel better, and live better.  Your body will feel the impact of quitting smoking almost immediately.  Within 20 minutes, blood pressure decreases. Your pulse returns to its normal level.  After 8 hours, carbon monoxide levels in the blood return to normal. Your oxygen level increases.  After 24 hours, the chance of having a heart attack starts to decrease. Your breath, hair, and body stop smelling like smoke.  After 48 hours, damaged nerve endings begin to recover. Your sense of taste and smell improve.  After 72 hours, the body is virtually free of nicotine. Your bronchial tubes relax and breathing becomes easier.  After 2 to 12 weeks, lungs can hold more air. Exercise becomes easier and circulation improves.  The risk of having a heart attack, stroke, cancer, or lung disease is greatly reduced.  After 1 year, the risk of coronary heart disease is cut in half.  After 5 years, the risk of stroke falls to the same as a nonsmoker.  After 10 years, the risk of lung cancer is cut in half and the risk of other cancers decreases significantly.  After 15 years, the risk of coronary heart disease drops, usually to the level of a nonsmoker.  If you are pregnant, quitting smoking will improve your chances of having a healthy baby.  The people you live with, especially any children, will be healthier.  You will have extra money to spend on things other than cigarettes. QUESTIONS TO THINK ABOUT BEFORE ATTEMPTING TO QUIT You may want to talk about your answers with your health care provider.  Why do you want to quit?  If you tried to quit in the past, what helped and what did not?  What will be the most difficult situations for you after you quit? How will you plan to handle them?  Who can help you through the  tough times? Your family? Friends? A health care provider?  What pleasures do you get from smoking? What ways can you still get pleasure if you quit? Here are some questions to ask your health care provider:  How can you help me to be successful at quitting?  What medicine do you think would be best for me and how should I take it?  What should I do if I need more help?  What is smoking withdrawal like? How can I get information on withdrawal? GET READY  Set a quit date.  Change your environment by getting rid of all cigarettes, ashtrays, matches, and lighters in your home, car, or work. Do not let people smoke in your home.  Review your past attempts to quit. Think about what worked and what did not. GET SUPPORT AND ENCOURAGEMENT You have a better chance of being successful if you have help. You can get support in many ways.  Tell your family, friends, and coworkers that you are going to quit and need their support. Ask them not to smoke around you.  Get individual, group, or telephone counseling and support. Programs are available at [employer] and  health centers. Call your local health department for information about programs in your area.  Spiritual beliefs and practices may help some smokers quit.  Download a "quit meter" on your computer to keep track of quit statistics, such as how long you have gone without smoking, cigarettes not smoked, and money saved.  Get a self-help book about quitting smoking and staying off tobacco. LEARN NEW SKILLS AND BEHAVIORS  Distract yourself from urges to smoke. Talk to someone, go for a walk, or occupy your time with a task.  Change your normal routine. Take a different route to work. Drink tea instead of coffee. Eat breakfast in a different place.  Reduce your stress. Take a hot bath, exercise, or read a book.  Plan something enjoyable to do every day. Reward yourself for not smoking.  Explore interactive web-based programs that  specialize in helping you quit. GET MEDICINE AND USE IT CORRECTLY Medicines can help you stop smoking and decrease the urge to smoke. Combining medicine with the above behavioral methods and support can greatly increase your chances of successfully quitting smoking.  Nicotine replacement therapy helps deliver nicotine to your body without the negative effects and risks of smoking. Nicotine replacement therapy includes nicotine gum, lozenges, inhalers, nasal sprays, and skin patches. Some may be available over-the-counter and others require a prescription.  Antidepressant medicine helps people abstain from smoking, but how this works is unknown. This medicine is available by prescription.  Nicotinic receptor partial agonist medicine simulates the effect of nicotine in your brain. This medicine is available by prescription. Ask your health care provider for advice about which medicines to use and how to use them based on your health history. Your health care provider will tell you what side effects to look out for if you choose to be on a medicine or therapy. Carefully read the information on the package. Do not use any other product containing nicotine while using a nicotine replacement product.  RELAPSE OR DIFFICULT SITUATIONS Most relapses occur within the first 3 months after quitting. Do not be discouraged if you start smoking again. Remember, most people try several times before finally quitting. You may have symptoms of withdrawal because your body is used to nicotine. You may crave cigarettes, be irritable, feel very hungry, cough often, get headaches, or have difficulty concentrating. The withdrawal symptoms are only temporary. They are strongest when you first quit, but they will go away within 10-14 days. To reduce the chances of relapse, try to:  Avoid drinking alcohol. Drinking lowers your chances of successfully quitting.  Reduce the amount of caffeine you consume. Once you quit smoking,  the amount of caffeine in your body increases and can give you symptoms, such as a rapid heartbeat, sweating, and anxiety.  Avoid smokers because they can make you want to smoke.  Do not let weight gain distract you. Many smokers will gain weight when they quit, usually less than 10 pounds. Eat a healthy diet and stay active. You can always lose the weight gained after you quit.  Find ways to improve your mood other than smoking. FOR MORE INFORMATION  www.smokefree.gov  Document Released: 02/21/2001 Document Revised: 07/14/2013 Document Reviewed: 06/08/2011 [facility] Patient Information 2015 [place], [place]. This information is not intended to replace advice given to you by your health care provider. Make sure you discuss any questions you have with your health care provider.  Emergency Department Resource Guide 1) Find a Doctor and Pay Out of Pocket Although you won't have to  find out who is covered by your insurance plan, it is a good idea to ask around and get recommendations. You will then need to call the office and see if the doctor you have chosen will accept you as a new patient and what types of options they offer for patients who are self-pay. Some doctors offer discounts or will set up payment plans for their patients who do not have insurance, but you will need to ask so you aren't surprised when you get to your appointment.  2) Contact Your Local Health Department Not all health departments have doctors that can see patients for sick visits, but many do, so it is worth a call to see if yours does. If you don't know where your local health department is, you can check in your phone book. The CDC also has a tool to help you locate your state's health department, and many state websites also have listings of all of their local health departments.  3) Find a Walk-in Clinic If your illness is not likely to be very severe or complicated, you may want to try a walk in clinic. These are  popping up all over the country in pharmacies, drugstores, and shopping centers. They're usually staffed by nurse practitioners or physician assistants that have been trained to treat common illnesses and complaints. They're usually fairly quick and inexpensive. However, if you have serious medical issues or chronic medical problems, these are probably not your best option.  No Primary Care Doctor: - Call Health Connect at  [phone] - they can help you locate a primary care doctor that  accepts your insurance, provides certain services, etc. - Physician Referral Service- [phone]  Chronic Pain Problems: Organization         Address  Phone   Notes  [facility]  [phone] Patients need to be referred by their primary care doctor.   Medication Assistance: Organization         Address  Phone   Notes  [facility] [address] [place] [place], [place] [address] [phone] --Must be a resident of [facility] -- Must have NO insurance coverage whatsoever (no Medicaid/ Medicare, etc.) -- The pt. MUST have a primary care doctor that directs their care regularly and follows them in the community   [facility]  [phone]   [employer]  [phone]    Agencies that provide inexpensive medical care: Organization         Address  Phone   Notes  [facility]  [phone]   [facility]    [phone]   [facility] [address] [place], [place] [address] [phone]   [facility] [address] [place], [place] [phone]   Planned Parenthood    [phone]   [facility]    [phone]   [facility]  [address], [place] Phone:  [phone], Fax:  [phone] Hours of Operation:  9 am - 6 pm, M-F.  Also accepts Medicaid/Medicare and self-pay.  [facility]  [address], [place] Phone: [phone], Fax: [phone]. Hours of Operation:  8:30 am - 5:30 pm, M-F.  Also accepts Medicaid and self-pay.  [facility] [place] [address], [employer] Phone: [phone]   [facility] [address] [place], [place]  [place], [place] [phone], Ext. [phone] Mondays & Thursdays: 7-9 AM.  First 15 patients are seen on a first come, first serve basis.    Medicaid-accepting [facility] Providers:  Organization         Address  Phone   Notes  [facility] [address], [place] [phone] Also accepts self-pay patients.  [facility] [address] Laurell Josephs [place], [place]  [phone]   [facility] [address], [place] [phone]   [facility] [address], [place] [phone]   Renaye Rakers [address], [place]   [phone] Only accepts [place] Access [place] patients after they have their name applied to their card.   Self-Pay (no insurance) in [facility]:  Organization         Address  Phone   Notes  Sickle Cell Patients, [facility] [address] [place], [place] [phone]   [facility] [address] [place], [place] [phone]   [facility]  [address], [place] [phone]   [facility]/Dr. Osei-Bonsu  [address] [place] or [address], [place] [phone] Phone number for both [place] and [place] locations is the same.  [facility]gent [facility] and [facility] [address] [place] [phone]   [facility] [address], [place] or [address] [phone]   [facility] [address], [place] [phone], phone; [phone], fax Sees patients 1st and 3rd Saturday  of every month.  Must not qualify for public or private insurance (i.e. Medicaid, Medicare, [place] Health Choice, Veterans' Benefits)  Household income should be no more than 200% of the poverty level The clinic cannot treat you if you are pregnant or think you are pregnant  Sexually transmitted diseases are not treated at the clinic.    Dental Care: Organization         Address  Phone  Notes  [facility] [address] [place], [place] [phone] Accepts children up to age 88 who are enrolled in [place] or [place] Health Choice; pregnant women with a Medicaid card; and children who have applied for Medicaid or [place] Health Choice, but were declined, whose parents can pay a reduced fee at time of service.  [facility] Department of [facility]  [address], [place] [phone] Accepts children up to age 54 who are enrolled in [place] or [place] Health Choice; pregnant women with a Medicaid card; and children who have applied for Medicaid or [place] Health Choice, but were declined, whose parents can pay a reduced fee at time of service.  [facility]  [address] [place], [place] [phone] Patients are seen by appointment only. Walk-ins are not accepted. [facility] will see patients 58 years of age and older. Monday - Tuesday (8am-5pm) Most Wednesdays (8:30-5pm) $30 per visit, cash only  [facility] Adult Dental Access PRO[facility]  [address], [facility] [phone] Patients are seen by appointment only. Walk-ins are not accepted. [facility] will see patients 46 years of age and older. One Wednesday Evening (Monthly: Volunteer Based).  $30 per visit, cash only  [employer]  (  [phone] for adults; Children under age 76, call Graduate Pediatric Dentistry at [phone]. Children aged 24-14, please call [phone] to request a pediatric application.   Dental services are provided in all areas of dental care including fillings, crowns and bridges, complete and partial dentures, implants, gum treatment, root canals, and extractions. Preventive care is also provided. Treatment is provided to both adults and children. Patients are selected via a lottery and there is often a waiting list.   [facility] [address], [place]  [phone] [email or web address]   [facility] [address] [place], [place] [phone], Ext. [phone] Second and Fourth Thursday of each month, opens at 6:30 AM; Clinic ends at 9 AM.  Patients are seen on a first-come first-served basis, and a limited number are seen during each clinic.   [facility]  [address] Ether Griffins [place], [place] [phone]   Eligibility Requirements You must have lived in [place], [place], or [place] counties for at least the last three months.   You cannot be eligible for state or federal sponsored [place], including [employer], [place], or [employer].   You generally cannot be eligible for healthcare insurance through your employer.    How to apply: Eligibility screenings are held every Tuesday and Wednesday afternoon from 1:00 pm until 4:00 pm. You do not need an appointment for the interview!  [facility] [address] [place], [place] [phone]   [facility]  [phone]   [facility]  [phone]   [facility] Health [facility]  [phone]    Behavioral Health Resources in the Community: Intensive Outpatient Programs Organization         Address  Phone  Notes  [facility] [address], [place], [place] [phone]   [facility] [address], [place], [place] [phone]   [facility] [address], [place], [place]  [phone]   [facility] [address]  [place], [place] [phone] or [phone]   Substance Abuse Resources Organization         Address  Phone  Notes  [facility]  [phone]   [facility]  [phone]   [facility]  [phone]   Floydene Flock  [phone]   Residential & Outpatient Substance Abuse Program  [phone]   Psychological Services Organization         Address  Phone  Notes  [facility]  [phone]   [facility]  [phone]   [facility] [address], [place] [phone] or [phone]    Mobile Crisis Teams Organization         Address  Phone  Notes  [facility], Mobile Crisis Care Unit  [phone]   [facility]  [address] [place], [place] [phone]   Doristine Locks [address] [place] [phone]    Self-Help/Support Groups Organization         Address  Phone             Notes  [facility] [place] - variety of support groups  336- I7437963 Call for more information  Narcotics Anonymous (NA), [facility] [address], [employer] [place]  2 meetings at this location   Chief Executive Officer  Notes  [facility] [address]    [place]  [phone]   [facility]  [address] [place] [address], [place], [place] [phone]   [facility] [address] [place], [place] [phone] Admissions: 8am-3pm M-F  [facility] [address]    [place], [place] [phone]   [facility] [address] [place], [place], [place] [phone]   [facility] [address],  [place], [place] [phone]   [facility] - Intensive Outpatient [address], [place], [place] [phone]   [facility] [address] [place]  [place], [place]  [phone] or [phone]   [facility] ([facility]) [address], [place], [place] [phone] Accepts Medicaid  [facility] [place] [address]  [place] [phone] Substance Abuse/Addiction Treatment   [facility] Organization         Address  Phone  Notes  [facility]  [phone]   Angie Fava, PhD [address] Ervin Knack [place], [place]   [phone] or [phone]   [facility] Behavioral   [address] [place], [place] [phone]   [facility] [address], [place], [place] [phone] Insurance/Medicaid/sponsorship through [facility] [address]                                    [place], [place] [phone] Therapy/tele-psych/case  [facility] [address], [place] [phone]    Dr. Lolly Mustache  [phone]   [facility] [place]  [employer] [facility] Dept. 1) [address] [place] 2) [address], [place] 3)  [address], [place] [phone]  [phone]   [facility] Child Abuse Hotline 930-237-1617 or [phone] (After Hours)

## 2016-10-17 ENCOUNTER — Encounter (HOSPITAL_COMMUNITY): Payer: Self-pay | Admitting: Emergency Medicine

## 2016-10-17 ENCOUNTER — Ambulatory Visit (HOSPITAL_COMMUNITY)
Admission: EM | Admit: 2016-10-17 | Discharge: 2016-10-17 | Disposition: A | Discharge status: Home / Self Care | Payer: Self-pay | Attending: Family Medicine | Admitting: Family Medicine

## 2016-10-17 DIAGNOSIS — S80869A Insect bite (nonvenomous), unspecified lower leg, initial encounter: Secondary | ICD-10-CM | POA: Insufficient documentation

## 2016-10-17 DIAGNOSIS — W57XXXA Bitten or stung by nonvenomous insect and other nonvenomous arthropods, initial encounter: Secondary | ICD-10-CM | POA: Insufficient documentation

## 2016-10-17 DIAGNOSIS — R21 Rash and other nonspecific skin eruption: Secondary | ICD-10-CM | POA: Insufficient documentation

## 2016-10-17 LAB — BASIC METABOLIC PANEL
ANION GAP: 10 (ref 5–15)
BUN: 12 mg/dL (ref 6–20)
CALCIUM: 9.6 mg/dL (ref 8.9–10.3)
CO2: 26 mmol/L (ref 22–32)
Chloride: 108 mmol/L (ref 101–111)
Creatinine, Ser: 0.66 mg/dL (ref 0.44–1.00)
GFR calc non Af Amer: >60 mL/min (ref >60)
Glucose, Bld: 114 mg/dL — ABNORMAL HIGH (ref 65–99)
Potassium: 4.6 mmol/L (ref 3.5–5.1)
Sodium: 144 mmol/L (ref 135–145)

## 2016-10-17 LAB — CBC WITH DIFFERENTIAL/PLATELET
BASOS ABS: 0.1 10*3/uL (ref 0.0–0.1)
BASOS PCT: 1 %
EOS PCT: 3 %
Eosinophils Absolute: 0.2 10*3/uL (ref 0.0–0.7)
HCT: 39.4 % (ref 36.0–46.0)
Hemoglobin: 13.1 g/dL (ref 12.0–15.0)
Lymphocytes Relative: 43 %
Lymphs Abs: 3.9 10*3/uL (ref 0.7–4.0)
MCH: 29.2 pg (ref 26.0–34.0)
MCHC: 33.2 g/dL (ref 30.0–36.0)
MCV: 87.8 fL (ref 78.0–100.0)
MONO ABS: 0.6 10*3/uL (ref 0.1–1.0)
Monocytes Relative: 7 %
Neutro Abs: 4.4 10*3/uL (ref 1.7–7.7)
Neutrophils Relative %: 48 %
PLATELETS: 265 10*3/uL (ref 150–400)
RBC: 4.49 MIL/uL (ref 3.87–5.11)
RDW: 13.9 % (ref 11.5–15.5)
WBC: 9.2 10*3/uL (ref 4.0–10.5)

## 2016-10-17 MED ORDER — DOXYCYCLINE HYCLATE 100 MG PO CAPS: 100.0000 mg | ORAL_CAPSULE | Freq: Two times a day (BID) | ORAL | 0 refills | Status: DC

## 2016-10-17 NOTE — ED Provider Notes (Signed)
  [facility]   [id number] 10/17/16 Arrival Time: 1809  ASSESSMENT & PLAN:  1. Tick bite, initial encounter   2. Rash and nonspecific skin eruption     Meds ordered this encounter  Medications  . doxycycline (VIBRAMYCIN) 100 MG capsule    Sig: Take 1 capsule (100 mg total) by mouth 2 (two) times daily.    Dispense:  28 capsule    Refill:  0    Order Specific Question:   Supervising Provider    Answer:   Mardella LaymanHAGLER, BRIAN [[id number]]   Starting on doxycycline, labs drawn, we'll notify with the results of any significant findings. Reviewed expectations re: course of current medical issues. Questions answered. Outlined signs and symptoms indicating need for more acute intervention. Patient verbalized understanding. After Visit Summary given.   SUBJECTIVE:  Jennifer Wallace is a 56 y.o. female who presents with complaint of Malaise, fatigue, muscle aches, and rash. States she found a tick on the back of her leg yesterday, she is uncertain how long the tick was on her leg. She showed a photograph of the tick, and the tick wasn't engorged. And there is an erythemic rash around the area where the tick bite was. Fever, nausea, vomiting, but otherwise feels unwell.  ROS: As per HPI, remainder of ROS negative.   OBJECTIVE:  Vitals:   10/17/16 1847  BP: 132/69  Pulse: 88  Resp: 18  Temp: 98.2 F (36.8 C)  TempSrc: Oral  SpO2: 100%     General appearance: alert; no distress HEENT: normocephalic; atraumatic; conjunctivae normal;  Neck: No lymphadenopathy Lungs: clear to auscultation bilaterally Heart: regular rate and rhythm Abdomen: soft, non-tender; bowel sounds normal; no masses or organomegaly; no guarding or rebound tenderness Back: no CVA tenderness Extremities: no cyanosis or edema; symmetrical with no gross deformities Skin: warm and dry, area of erythema around the tick bite, no evidence of retained head Neurologic: Grossly normal Psychological:  alert and  cooperative; normal mood and affect  Procedures:     Labs Reviewed  B. BURGDORFI ANTIBODIES  ROCKY MTN SPOTTED FVR ABS PNL(IGG+IGM)  CBC WITH DIFFERENTIAL/PLATELET  BASIC METABOLIC PANEL    No results found.  Allergies  Allergen Reactions  . Sulfa Antibiotics Anaphylaxis    PMHx, SurgHx, SocialHx, Medications, and Allergies were reviewed in the Visit Navigator and updated as appropriate.       Dorena BodoKennard, Simar Pothier, NP 10/17/16 1946

## 2016-10-17 NOTE — ED Triage Notes (Signed)
[facility] a [facility] after removing a tick yesterday.

## 2016-10-17 NOTE — Discharge Instructions (Signed)
We have drawn multiple labs and started you on Doxycycline. If your labs are negative, do not stop taking the medicine as you may have false negatives due to the short amount of time it has been since you have been bit. If your symptoms worsen, follow up with your primary care provider or return to clinic as needed.

## 2016-10-19 LAB — B. BURGDORFI ANTIBODIES: B burgdorferi Ab IgG+IgM: <0.91 {ISR} (ref 0.00–0.90)

## 2016-10-20 LAB — ROCKY MTN SPOTTED FVR ABS PNL(IGG+IGM)
RMSF IGG: NEGATIVE
RMSF IGM: 0.27 {index} (ref 0.00–0.89)

## 2017-04-25 ENCOUNTER — Ambulatory Visit (HOSPITAL_COMMUNITY)
Admission: EM | Admit: 2017-04-25 | Discharge: 2017-04-25 | Disposition: A | Discharge status: Home / Self Care | Payer: Self-pay | Attending: Family Medicine | Admitting: Family Medicine

## 2017-04-25 ENCOUNTER — Other Ambulatory Visit: Payer: Self-pay

## 2017-04-25 ENCOUNTER — Ambulatory Visit (INDEPENDENT_AMBULATORY_CARE_PROVIDER_SITE_OTHER): Payer: Self-pay

## 2017-04-25 ENCOUNTER — Encounter (HOSPITAL_COMMUNITY): Payer: Self-pay | Admitting: Emergency Medicine

## 2017-04-25 DIAGNOSIS — J181 Lobar pneumonia, unspecified organism: Secondary | ICD-10-CM

## 2017-04-25 DIAGNOSIS — J189 Pneumonia, unspecified organism: Secondary | ICD-10-CM

## 2017-04-25 MED ORDER — LEVOFLOXACIN 500 MG PO TABS: 500.0000 mg | ORAL_TABLET | Freq: Every day | ORAL | 0 refills | Status: DC

## 2017-04-25 MED ORDER — HYDROCODONE-HOMATROPINE 5-1.5 MG/5ML PO SYRP: 5.0000 mL | ORAL_SOLUTION | Freq: Four times a day (QID) | ORAL | 0 refills | Status: DC | PRN

## 2017-04-25 MED ORDER — PREDNISONE 20 MG PO TABS: ORAL_TABLET | ORAL | 0 refills | Status: DC

## 2017-04-25 NOTE — ED Provider Notes (Signed)
[facility]   [id number] 04/25/17 Arrival Time: 1837   SUBJECTIVE:  Jennifer Wallace is a 57 y.o. female who presents to the urgent care with complaint of coughing x2 days, states its hard for her to catch her breath shes coughing so hard.   Patient is a smoker.  No chest pain  History reviewed. No pertinent past medical history. No family history on file. Social History   Socioeconomic History  . Marital status: Divorced    Spouse name: Not on file  . Number of children: Not on file  . Years of education: Not on file  . Highest education level: Not on file  Social Needs  . Financial resource strain: Not on file  . Food insecurity - worry: Not on file  . Food insecurity - inability: Not on file  . Transportation needs - medical: Not on file  . Transportation needs - non-medical: Not on file  Occupational History  . Not on file  Tobacco Use  . Smoking status: Current Every Day Smoker    Packs/day: 0.50    Years: 40.00    Pack years: 20.00    Types: Cigarettes  . Smokeless tobacco: Never Used  Substance and Sexual Activity  . Alcohol use: No  . Drug use: No  . Sexual activity: Yes    Birth control/protection: Surgical  Other Topics Concern  . Not on file  Social History Narrative  . Not on file   No outpatient medications have been marked as taking for the 04/25/17 encounter [facility] Encounter).   Allergies  Allergen Reactions  . Sulfa Antibiotics Anaphylaxis      ROS: As per HPI, remainder of ROS negative.   OBJECTIVE:   Vitals:   04/25/17 1930  BP: 115/60  Pulse: (!) 112  Resp: 18  Temp: 99.8 F (37.7 C)  SpO2: 93%     General appearance: alert; no distress Eyes: PERRL; EOMI; conjunctiva normal HENT: normocephalic; atraumatic; TMs normal, canal normal, external ears normal without trauma; nasal mucosa normal; oral mucosa normal Neck: supple Lungs: Wheezes and rales on inspiration of left lower lobe. Heart: regular rate and rhythm;  tachycardic Abdomen: soft, non-tender; bowel sounds normal; no masses or organomegaly; no guarding or rebound tenderness Back: no CVA tenderness Extremities: no cyanosis or edema; symmetrical with no gross deformities Skin: warm and dry; half by 1 cm oval skin lesion below left tragus Neurologic: normal gait; grossly normal Psychological: alert and cooperative; normal mood and affect      Labs:  Results for orders placed or performed during the hospital encounter of 10/17/16  B. burgdorfi antibodies  Result Value Ref Range   B burgdorferi Ab IgG+IgM <0.91 0.00 - 0.90 ISR  Rocky mtn spotted fvr abs pnl(IgG+IgM)  Result Value Ref Range   RMSF IgG Negative Negative   RMSF IgM 0.27 0.00 - 0.89 index  CBC with Differential  Result Value Ref Range   WBC 9.2 4.0 - 10.5 K/uL   RBC 4.49 3.87 - 5.11 MIL/uL   Hemoglobin 13.1 12.0 - 15.0 g/dL   HCT 40.9 81.1 - 91.4 %   MCV 87.8 78.0 - 100.0 fL   MCH 29.2 26.0 - 34.0 pg   MCHC 33.2 30.0 - 36.0 g/dL   RDW 78.2 95.6 - 21.3 %   Platelets 265 150 - 400 K/uL   Neutrophils Relative % 48 %   Neutro Abs 4.4 1.7 - 7.7 K/uL   Lymphocytes Relative 43 %   Lymphs Abs 3.9 0.7 -  4.0 K/uL   Monocytes Relative 7 %   Monocytes Absolute 0.6 0.1 - 1.0 K/uL   Eosinophils Relative 3 %   Eosinophils Absolute 0.2 0.0 - 0.7 K/uL   Basophils Relative 1 %   Basophils Absolute 0.1 0.0 - 0.1 K/uL  Basic metabolic panel  Result Value Ref Range   Sodium 144 135 - 145 mmol/L   Potassium 4.6 3.5 - 5.1 mmol/L   Chloride 108 101 - 111 mmol/L   CO2 26 22 - 32 mmol/L   Glucose, Bld 114 (H) 65 - 99 mg/dL   BUN 12 6 - 20 mg/dL   Creatinine, Ser 1.610.66 0.44 - 1.00 mg/dL   Calcium 9.6 8.9 - 09.610.3 mg/dL   GFR calc non Af Amer >60 >60 mL/min   GFR calc Af Amer >60 >60 mL/min   Anion gap 10 5 - 15    Labs Reviewed - No data to display  No results found.     ASSESSMENT & PLAN:  1. Community acquired pneumonia of left lower lobe of lung (HCC)     Meds  ordered this encounter  Medications  . levofloxacin (LEVAQUIN) 500 MG tablet    Sig: Take 1 tablet (500 mg total) by mouth daily.    Dispense:  10 tablet    Refill:  0  . HYDROcodone-homatropine (HYDROMET) 5-1.5 MG/5ML syrup    Sig: Take 5 mLs by mouth every 6 (six) hours as needed for cough.    Dispense:  60 mL    Refill:  0  . predniSONE (DELTASONE) 20 MG tablet    Sig: Two daily with food    Dispense:  10 tablet    Refill:  0    Reviewed expectations re: course of current medical issues. Questions answered. Outlined signs and symptoms indicating need for more acute intervention. Patient verbalized understanding. After Visit Summary given.    Procedures: Follow up CXR recommended in 5-7 days     Elvina SidleLauenstein, Dazja Houchin, MD 04/25/17 1956

## 2017-04-25 NOTE — Discharge Instructions (Signed)
Make sure you get a dermatologist to look at the skin lesion on the left side of your face.  It appears to be an early skin cancer.  You will need a follow-up chest x-ray on Monday.

## 2017-04-25 NOTE — ED Triage Notes (Signed)
Pt c/o coughing x2 days, states its hard for her to catch her breath shes coughing so hard.

## 2017-10-20 ENCOUNTER — Ambulatory Visit (INDEPENDENT_AMBULATORY_CARE_PROVIDER_SITE_OTHER): Payer: Self-pay

## 2017-10-20 ENCOUNTER — Encounter (HOSPITAL_COMMUNITY): Payer: Self-pay

## 2017-10-20 ENCOUNTER — Other Ambulatory Visit: Payer: Self-pay

## 2017-10-20 ENCOUNTER — Ambulatory Visit (HOSPITAL_COMMUNITY)
Admission: EM | Admit: 2017-10-20 | Discharge: 2017-10-20 | Disposition: A | Discharge status: Home / Self Care | Payer: Self-pay | Attending: Internal Medicine | Admitting: Internal Medicine

## 2017-10-20 DIAGNOSIS — J209 Acute bronchitis, unspecified: Secondary | ICD-10-CM

## 2017-10-20 MED ORDER — AZITHROMYCIN 250 MG PO TABS: ORAL_TABLET | ORAL | 0 refills | Status: AC

## 2017-10-20 MED ORDER — ALBUTEROL SULFATE HFA 108 (90 BASE) MCG/ACT IN AERS
2.0000 | INHALATION_SPRAY | Freq: Once | RESPIRATORY_TRACT | Status: AC
  Administered 2017-10-20: 2 via RESPIRATORY_TRACT

## 2017-10-20 MED ORDER — PREDNISONE 20 MG PO TABS: 40.0000 mg | ORAL_TABLET | Freq: Every day | ORAL | 0 refills | Status: AC

## 2017-10-20 MED ORDER — ALBUTEROL SULFATE HFA 108 (90 BASE) MCG/ACT IN AERS
INHALATION_SPRAY | RESPIRATORY_TRACT | Status: AC
  Filled 2017-10-20: qty 6.7

## 2017-10-20 NOTE — ED Triage Notes (Signed)
Pt presents today with cough, fever, SOB, and congestion x3 days. States that her boyfriend was sick with the same sxs recently. Has tried OTC robitussin with no very little relief. Has been coughing so much she has not been able to sleep well at night.

## 2017-10-20 NOTE — ED Provider Notes (Signed)
[facility]    CSN: [id number] Arrival date & time: 10/20/17  1036     History   Chief Complaint Chief Complaint  Patient presents with  . Appointment    11:00  . Cough    HPI Jennifer Wallace is a 57 y.o. female.   Jennifer Wallace presents with complaints of cough, non productive, headache, which started approximately 3 days ago. Poor sleep due to cough. Boyfriend will URI last week. Subjective fever last night. No ear pain or sore throat. Has had decreased appetite. Smokes and states has increased her smoking recently due to stress. No gi/gu complaints. No nasal congestion. Took robitussin which did help some. No history of asthma or copd. Hx of tonsillectomy.    ROS per HPI.      History reviewed. No pertinent past medical history.  There are no active problems to display for this patient.   Past Surgical History:  Procedure Laterality Date  . CESAREAN SECTION  x2  . EYE SURGERY    . PARTIAL HYSTERECTOMY    . TONSILLECTOMY      OB History   None      Home Medications    Prior to Admission medications   Medication Sig Start Date End Date Taking? Authorizing Provider  azithromycin (ZITHROMAX) 250 MG tablet Take 2 tablets (500 mg total) by mouth daily for 1 day, THEN 1 tablet (250 mg total) daily for 4 days. 10/22/17 10/27/17  Georgetta HaberBurky, Deforrest Bogle B, NP  predniSONE (DELTASONE) 20 MG tablet Take 2 tablets (40 mg total) by mouth daily with breakfast for 5 days. 10/20/17 10/25/17  Georgetta HaberBurky, Izumi Mixon B, NP    Family History No family history on file.  Social History Social History   Tobacco Use  . Smoking status: Current Every Day Smoker    Packs/day: 0.50    Years: 40.00    Pack years: 20.00    Types: Cigarettes  . Smokeless tobacco: Never Used  Substance Use Topics  . Alcohol use: No  . Drug use: No     Allergies   Sulfa antibiotics   Review of Systems Review of Systems   Physical Exam Triage Vital Signs ED Triage Vitals  Enc Vitals Group     BP  10/20/17 1111 (!) 151/93     Pulse Rate 10/20/17 1111 94     Resp 10/20/17 1111 16     Temp 10/20/17 1111 99.3 F (37.4 C)     Temp Source 10/20/17 1111 Oral     SpO2 10/20/17 1111 98 %     Weight --      Height --      Head Circumference --      Peak Flow --      Pain Score 10/20/17 1112 6     Pain Loc --      Pain Edu? --      Excl. in GC? --    No data found.  Updated Vital Signs BP (!) 151/93 (BP Location: Right Arm)   Pulse 94   Temp 99.3 F (37.4 C) (Oral)   Resp 16   SpO2 98%    Physical Exam  Constitutional: She is oriented to person, place, and time. She appears well-developed and well-nourished. No distress.  HENT:  Head: Normocephalic and atraumatic.  Right Ear: Tympanic membrane, external ear and ear canal normal.  Left Ear: Tympanic membrane, external ear and ear canal normal.  Nose: Nose normal.  Mouth/Throat: Uvula is midline, oropharynx is clear and moist  and mucous membranes are normal. No tonsillar exudate.  Eyes: Pupils are equal, round, and reactive to light. Conjunctivae and EOM are normal.  Cardiovascular: Normal rate, regular rhythm and normal heart sounds.  Pulmonary/Chest: Effort normal and breath sounds normal. No respiratory distress. She has no wheezes.  Strong frequent dry cough noted   Neurological: She is alert and oriented to person, place, and time.  Skin: Skin is warm and dry.     UC Treatments / Results  Labs (all labs ordered are listed, but only abnormal results are displayed) Labs Reviewed - No data to display  EKG None  Radiology Dg Chest 2 View  Result Date: 10/20/2017 CLINICAL DATA:  Patient c/o cough and sob x 2 weeks, pain with deep breaths. Smoker. Hx of pneumonia EXAM: CHEST - 2 VIEW COMPARISON:  04/25/2017 and older exams. FINDINGS: Cardiac silhouette is normal in size. No mediastinal or hilar masses. No evidence of adenopathy. Small faint nodule projecting over the the lateral right lower lung has been stable on  multiple prior exams. Mild stable apical parenchymal scarring. No evidence of pneumonia or pulmonary edema. No pleural effusion or pneumothorax. Skeletal structures are intact. IMPRESSION: No active cardiopulmonary disease. Electronically Signed   By: Amie Portland M.D.   On: 10/20/2017 11:51    Procedures Procedures (including critical care time)  Medications Ordered in UC Medications  albuterol (PROVENTIL HFA;VENTOLIN HFA) 108 (90 Base) MCG/ACT inhaler 2 puff (has no administration in time range)    Initial Impression / Assessment and Plan / UC Course  I have reviewed the triage vital signs and the nursing notes.  Pertinent labs & imaging results that were available during my care of the patient were reviewed by me and considered in my medical decision making (see chart for details).     Lungs clear. Non toxic in appearance. Afebrile. No increased work of breathing. Xray reassuring. Likely bronchitis. encouraged decrease to quit smoking. May use azithromycin next week if worsening or persistent symptoms. Return precautions provided. Patient verbalized understanding and agreeable to plan.    Final Clinical Impressions(s) / UC Diagnoses   Final diagnoses:  Acute bronchitis, unspecified organism     Discharge Instructions     Push fluids to ensure adequate hydration and keep secretions thin.  Tylenol and/or ibuprofen as needed for pain or fevers.  Use of inhaler every 4-6 hours as needed for cough or shortness of breath. This is likely viral therefore no antibiotics have been started at this time. However, if worsening of symptoms on Monday may start and complete course of antibiotics.  5 days of prednisone.  Please continue to decrease to quit smoking.  If symptoms worsen or do not improve in the next week to return to be seen or to follow up with your PCP.     ED Prescriptions    Medication Sig Dispense Auth. Provider   predniSONE (DELTASONE) 20 MG tablet Take 2 tablets (40  mg total) by mouth daily with breakfast for 5 days. 10 tablet Linus Mako B, NP   azithromycin (ZITHROMAX) 250 MG tablet Take 2 tablets (500 mg total) by mouth daily for 1 day, THEN 1 tablet (250 mg total) daily for 4 days. 6 tablet Georgetta Haber, NP     Controlled Substance Prescriptions [place] Controlled Substance Registry consulted? Not Applicable   Georgetta Haber, NP 10/20/17 1206

## 2017-10-20 NOTE — Discharge Instructions (Signed)
Push fluids to ensure adequate hydration and keep secretions thin.  Tylenol and/or ibuprofen as needed for pain or fevers.  Use of inhaler every 4-6 hours as needed for cough or shortness of breath. This is likely viral therefore no antibiotics have been started at this time. However, if worsening of symptoms on Monday may start and complete course of antibiotics.  5 days of prednisone.  Please continue to decrease to quit smoking.  If symptoms worsen or do not improve in the next week to return to be seen or to follow up with your PCP.

## 2017-10-20 NOTE — ED Notes (Signed)
Bed: UC01 Expected date:  Expected time:  Means of arrival:  Comments: 

## 2018-12-20 ENCOUNTER — Ambulatory Visit
Admission: EM | Admit: 2018-12-20 | Discharge: 2018-12-20 | Disposition: A | Discharge status: Home / Self Care | Payer: Self-pay | Attending: Emergency Medicine | Admitting: Emergency Medicine

## 2018-12-20 ENCOUNTER — Other Ambulatory Visit: Payer: Self-pay

## 2018-12-20 ENCOUNTER — Encounter: Payer: Self-pay | Admitting: Emergency Medicine

## 2018-12-20 DIAGNOSIS — H60502 Unspecified acute noninfective otitis externa, left ear: Secondary | ICD-10-CM

## 2018-12-20 DIAGNOSIS — H6123 Impacted cerumen, bilateral: Secondary | ICD-10-CM

## 2018-12-20 MED ORDER — NEOMYCIN-POLYMYXIN-HC 3.5-10000-1 OT SUSP: 4.0000 [drp] | Freq: Three times a day (TID) | OTIC | 0 refills | Status: DC

## 2018-12-20 NOTE — Discharge Instructions (Addendum)
Use ear drops as prescribed - NOTHING ELSE SHOULD GO IN YOUR EAR! Avoid using Q-tips as this can cause worsening cerumen impaction and damage her eardrum. Return for worsening pain, fever, bloody discharge.

## 2018-12-20 NOTE — ED Notes (Signed)
Patient able to ambulate independently  

## 2018-12-20 NOTE — ED Triage Notes (Signed)
Patient presents to [facility] for assessment of left ear pain and fullness x 5 days, tried debrox with a small amount of wax removed.  Patient is concerned for pushing wax too far down in her ear.

## 2018-12-20 NOTE — ED Provider Notes (Signed)
[facility]    CSN: [id number] Arrival date & time: 12/20/18  1804      History   Chief Complaint Chief Complaint  Patient presents with  . Ear Fullness    HPI Jennifer Wallace is a 58 y.o. female presenting for 5-day course of left ear pain and fullness.  Patient has history of cerumen impaction: Has tried Debrox with minimal cerumen disimpaction.  Patient voices concern regarding "pushing the wax to right".  Denies change in hearing, tinnitus, lightheadedness, dizziness, tooth pain, facial pain fever.  Patient denies recent trauma, travel, bloody discharge.   History reviewed. No pertinent past medical history.  There are no active problems to display for this patient.   Past Surgical History:  Procedure Laterality Date  . CESAREAN SECTION  x2  . EYE SURGERY    . PARTIAL HYSTERECTOMY    . TONSILLECTOMY      OB History   No obstetric history on file.      Home Medications    Prior to Admission medications   Medication Sig Start Date End Date Taking? Authorizing Provider  neomycin-polymyxin-hydrocortisone (CORTISPORIN) 3.5-10000-1 OTIC suspension Place 4 drops into the left ear 3 (three) times daily. 12/20/18   Hall-Potvin, [place], PA-C    Family History History reviewed. No pertinent family history.  Social History Social History   Tobacco Use  . Smoking status: Current Every Day Smoker    Packs/day: 0.50    Years: 40.00    Pack years: 20.00    Types: Cigarettes  . Smokeless tobacco: Never Used  Substance Use Topics  . Alcohol use: No  . Drug use: No     Allergies   Sulfa antibiotics   Review of Systems Review of Systems  Constitutional: Negative for fatigue and fever.  HENT: Positive for ear pain. Negative for congestion, dental problem, ear discharge, facial swelling, hearing loss, sinus pain, sore throat, trouble swallowing and voice change.   Eyes: Negative for photophobia, pain and visual disturbance.  Respiratory: Negative  for cough and shortness of breath.   Cardiovascular: Negative for chest pain and palpitations.  Gastrointestinal: Negative for diarrhea and vomiting.  Musculoskeletal: Negative for arthralgias and myalgias.  Neurological: Negative for dizziness and headaches.     Physical Exam Triage Vital Signs ED Triage Vitals  Enc Vitals Group     BP 12/20/18 1813 (!) 152/86     Pulse Rate 12/20/18 1813 83     Resp 12/20/18 1813 18     Temp 12/20/18 1813 97.8 F (36.6 C)     Temp Source 12/20/18 1813 Oral     SpO2 12/20/18 1813 98 %     Weight --      Height --      Head Circumference --      Peak Flow --      Pain Score 12/20/18 1814 4     Pain Loc --      Pain Edu? --      Excl. in GC? --    No data found.  Updated Vital Signs BP (!) 152/86 (BP Location: Right Arm)   Pulse 83   Temp 97.8 F (36.6 C) (Oral)   Resp 18   SpO2 98%   Visual Acuity Right Eye Distance:   Left Eye Distance:   Bilateral Distance:    Right Eye Near:   Left Eye Near:    Bilateral Near:     Physical Exam Constitutional:      General: She is  not in acute distress. HENT:     Head: Normocephalic and atraumatic.     Jaw: There is normal jaw occlusion. No tenderness or pain on movement.     Right Ear: Hearing, ear canal and external ear normal. No tenderness. There is impacted cerumen. No mastoid tenderness.     Left Ear: Hearing, ear canal and external ear normal. No tenderness. There is impacted cerumen. No mastoid tenderness.     Ears:     Comments: Negative tragal tenderness bilaterally    Nose: Nose normal. No nasal deformity, septal deviation or nasal tenderness.     Right Turbinates: Not swollen or pale.     Left Turbinates: Not swollen or pale.     Right Sinus: No maxillary sinus tenderness or frontal sinus tenderness.     Left Sinus: No maxillary sinus tenderness or frontal sinus tenderness.     Mouth/Throat:     Lips: Pink. No lesions.     Mouth: Mucous membranes are moist. No injury.      Pharynx: Oropharynx is clear. Uvula midline. No posterior oropharyngeal erythema or uvula swelling.     Comments: no tonsillar exudate or hypertrophy Eyes:     General: No scleral icterus.    Conjunctiva/sclera: Conjunctivae normal.     Pupils: Pupils are equal, round, and reactive to light.  Neck:     Musculoskeletal: Normal range of motion and neck supple. No muscular tenderness.  Cardiovascular:     Rate and Rhythm: Normal rate.  Pulmonary:     Effort: Pulmonary effort is normal.  Lymphadenopathy:     Cervical: No cervical adenopathy.  Neurological:     Mental Status: She is alert and oriented to person, place, and time.      UC Treatments / Results  Labs (all labs ordered are listed, but only abnormal results are displayed) Labs Reviewed - No data to display  EKG   Radiology No results found.  Procedures Procedures (including critical care time)  Medications Ordered in UC Medications - No data to display  Initial Impression / Assessment and Plan / UC Course  I have reviewed the triage vital signs and the nursing notes.  Pertinent labs & imaging results that were available during my care of the patient were reviewed by me and considered in my medical decision making (see chart for details).     1.  AOE Left ear canal mildly edematous, erythematous, wound, waxy discharge dead skin tissue occasionally will be removed in office.  Will initiate Cortisporin drops today to cover for possible AOE.  ENT contact information for follow-up should patient have any persistent, worsening symptoms.  Return precautions discussed, patient verbalized understanding and is agreeable to plan.   Final Clinical Impressions(s) / UC Diagnoses   Final diagnoses:  Bilateral impacted cerumen  Acute otitis externa of left ear, unspecified type     Discharge Instructions     Use ear drops as prescribed - NOTHING ELSE SHOULD GO IN YOUR EAR! Avoid using Q-tips as this can cause  worsening cerumen impaction and damage her eardrum. Return for worsening pain, fever, bloody discharge.    ED Prescriptions    Medication Sig Dispense Auth. Provider   neomycin-polymyxin-hydrocortisone (CORTISPORIN) 3.5-10000-1 OTIC suspension Place 4 drops into the left ear 3 (three) times daily. 10 mL Hall-Potvin, [place], PA-C     PDMP not reviewed this encounter.   Neldon Mc [place], [place] 12/20/18 1936

## 2019-12-20 IMAGING — DX DG CHEST 2V
2 series · 2 of 2 positions shown · non-contrast
Comparison: 04/25/2017 and older exams.

CLINICAL DATA: Patient c/o cough and sob x 2 weeks, pain with deep
breaths. Smoker. Hx of pneumonia

EXAM:
CHEST - 2 VIEW

[chest pa]
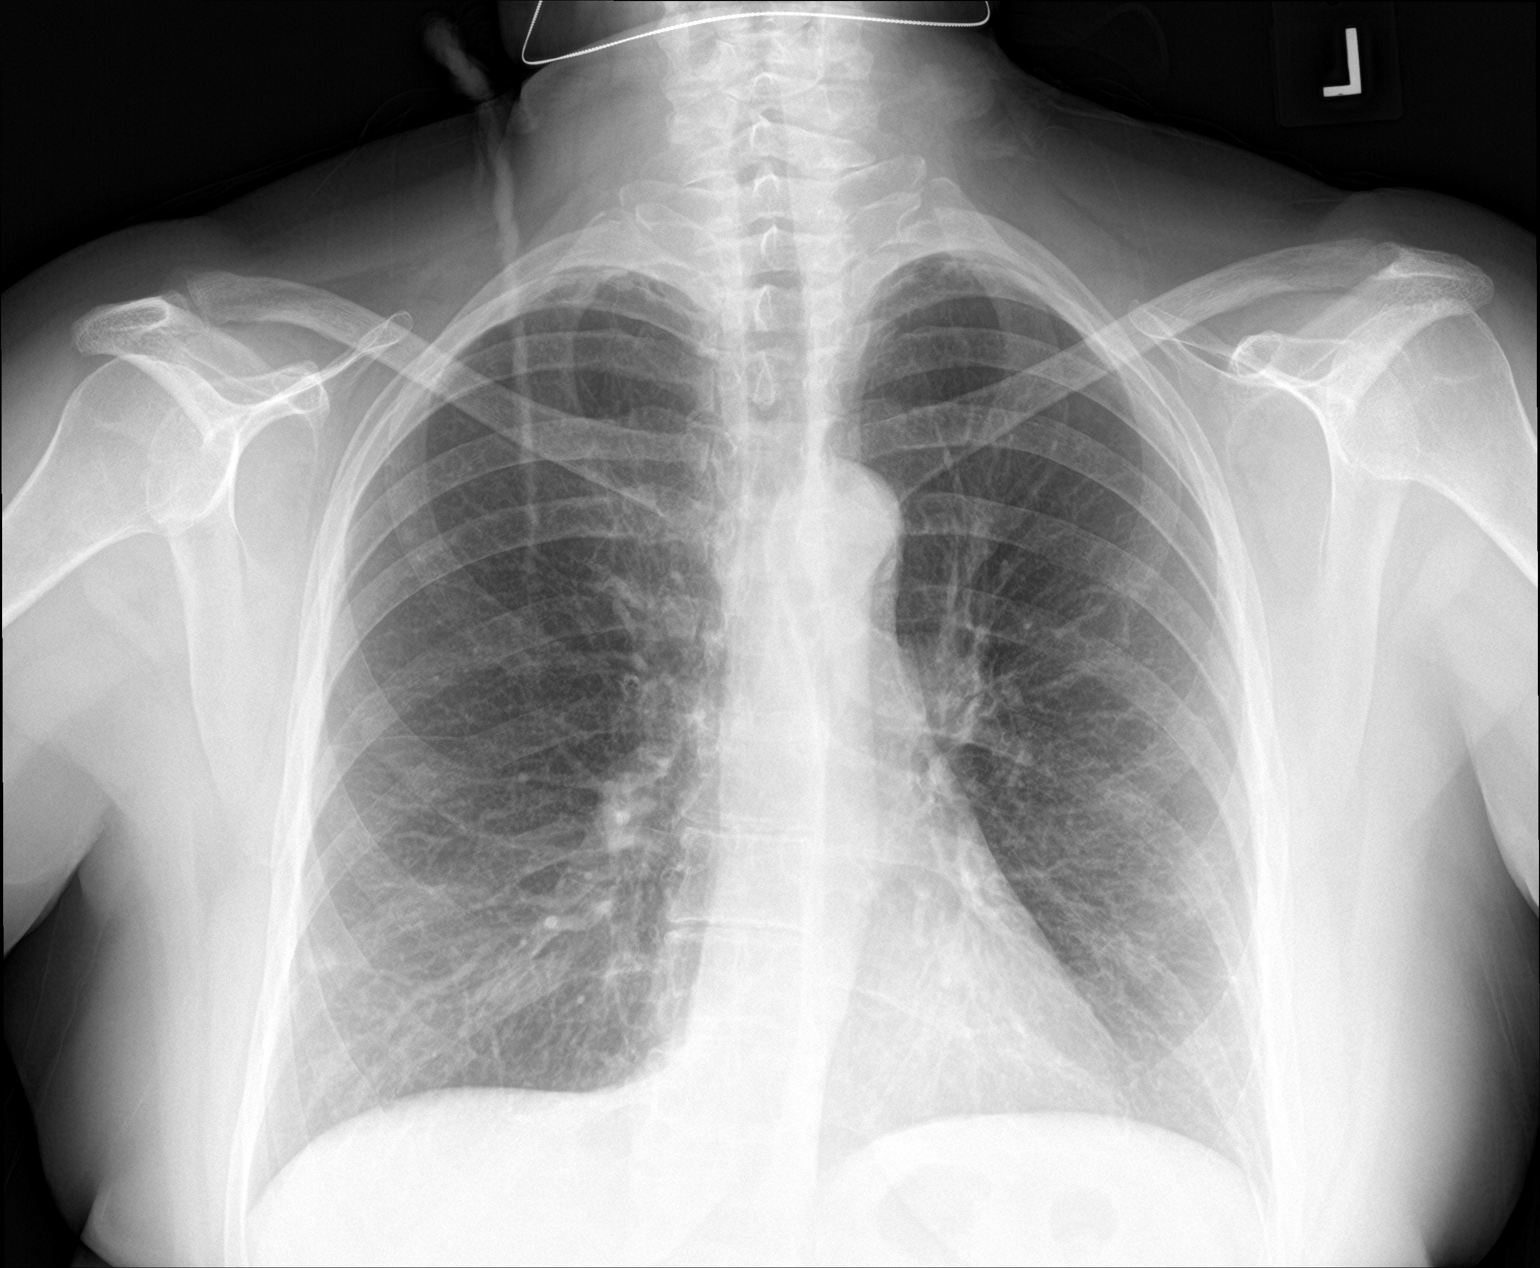

[chest lat]
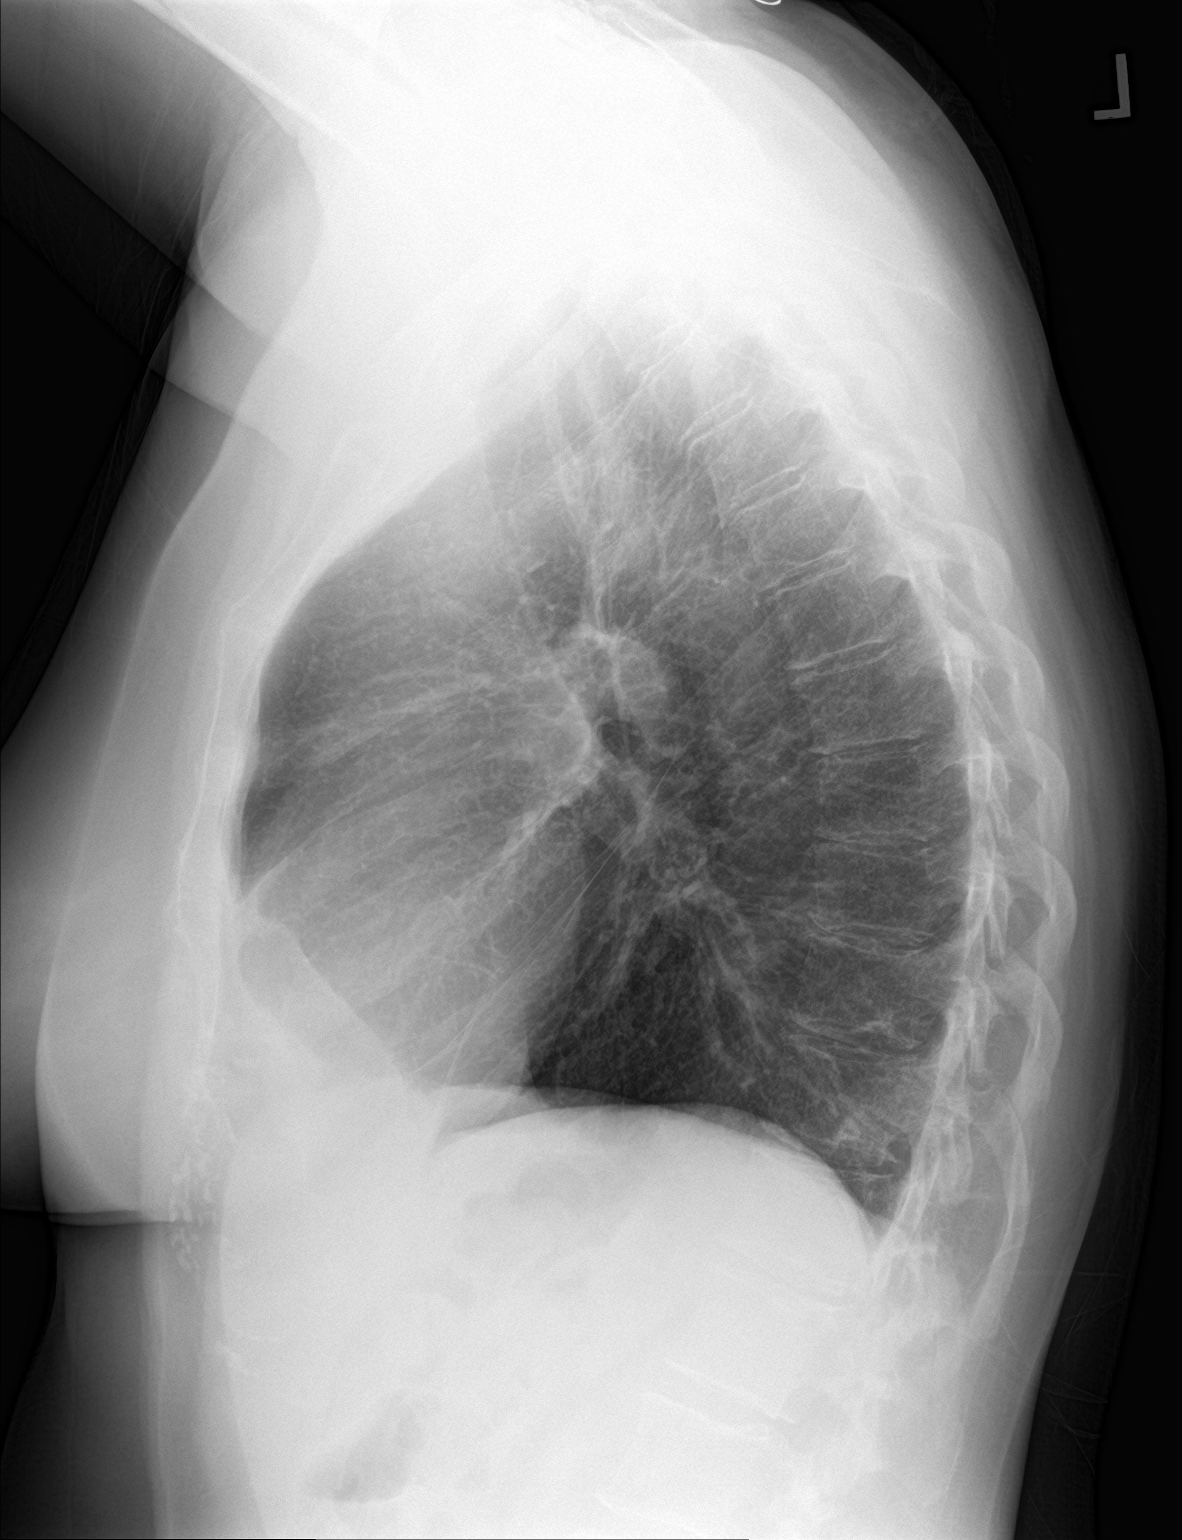

[2 of 2 positions shown; findings below may reference images not displayed]

FINDINGS: Cardiac silhouette is normal in size. No mediastinal or hilar
masses. No evidence of adenopathy.

Small faint nodule projecting over the the lateral right lower lung
has been stable on multiple prior exams. Mild stable apical
parenchymal scarring. No evidence of pneumonia or pulmonary edema.
No pleural effusion or pneumothorax.

Skeletal structures are intact.
IMPRESSION: No active cardiopulmonary disease.

## 2021-10-12 ENCOUNTER — Ambulatory Visit
Admission: EM | Admit: 2021-10-12 | Discharge: 2021-10-12 | Disposition: A | Discharge status: Home / Self Care | Payer: 59 | Attending: Internal Medicine | Admitting: Internal Medicine

## 2021-10-12 ENCOUNTER — Encounter: Payer: Self-pay | Admitting: Emergency Medicine

## 2021-10-12 ENCOUNTER — Ambulatory Visit (INDEPENDENT_AMBULATORY_CARE_PROVIDER_SITE_OTHER): Payer: 59

## 2021-10-12 DIAGNOSIS — B349 Viral infection, unspecified: Secondary | ICD-10-CM

## 2021-10-12 DIAGNOSIS — R509 Fever, unspecified: Secondary | ICD-10-CM

## 2021-10-12 DIAGNOSIS — R051 Acute cough: Secondary | ICD-10-CM

## 2021-10-12 DIAGNOSIS — R0689 Other abnormalities of breathing: Secondary | ICD-10-CM | POA: Diagnosis not present

## 2021-10-12 LAB — POCT INFLUENZA A/B
Influenza A, POC: NEGATIVE
Influenza B, POC: NEGATIVE

## 2021-10-12 MED ORDER — ALBUTEROL SULFATE HFA 108 (90 BASE) MCG/ACT IN AERS: 1.0000 | INHALATION_SPRAY | Freq: Four times a day (QID) | RESPIRATORY_TRACT | 0 refills | Status: AC | PRN

## 2021-10-12 MED ORDER — FLUTICASONE PROPIONATE 50 MCG/ACT NA SUSP: 1.0000 | Freq: Every day | NASAL | 0 refills | Status: DC

## 2021-10-12 MED ORDER — BENZONATATE 100 MG PO CAPS: 100.0000 mg | ORAL_CAPSULE | Freq: Three times a day (TID) | ORAL | 0 refills | Status: DC | PRN

## 2021-10-12 MED ORDER — ACETAMINOPHEN 325 MG PO TABS
650.0000 mg | ORAL_TABLET | Freq: Once | ORAL | Status: AC
  Administered 2021-10-12: 650 mg via ORAL

## 2021-10-12 NOTE — ED Triage Notes (Signed)
Pt is present today with c/o cough, body aches, fever, chest congestion and HA. Pt sx started Monday

## 2021-10-12 NOTE — ED Provider Notes (Signed)
[facility]    CSN: [id number] Arrival date & time: 10/12/21  5597      History   Chief Complaint Chief Complaint  Patient presents with   Fever   Generalized Body Aches   Headache   Cough    HPI Genoveva Singleton is a 61 y.o. female.   Patient presents with cough, body aches, fever, headache that started about 3 days ago.  Denies any known sick contacts.  Tmax at home was 103.  Has taken over-the-counter cold and flu medication with minimal improvement.  Denies chest pain, shortness of breath, nasal congestion, runny nose, sore throat, ear pain, nausea, vomiting, diarrhea, abdominal pain.  Denies history of asthma or COPD but patient reports history of bronchitis.   Fever Headache Cough   History reviewed. No pertinent past medical history.  There are no problems to display for this patient.   Past Surgical History:  Procedure Laterality Date   CESAREAN SECTION  x2   EYE SURGERY     PARTIAL HYSTERECTOMY     TONSILLECTOMY      OB History   No obstetric history on file.      Home Medications    Prior to Admission medications   Medication Sig Start Date End Date Taking? Authorizing Provider  albuterol (VENTOLIN HFA) 108 (90 Base) MCG/ACT inhaler Inhale 1-2 puffs into the lungs every 6 (six) hours as needed for wheezing or shortness of breath. 10/12/21  Yes Kambre Messner, Rolly Salter E, FNP  benzonatate (TESSALON) 100 MG capsule Take 1 capsule (100 mg total) by mouth every 8 (eight) hours as needed for cough. 10/12/21  Yes Deo Mehringer, Rolly Salter E, FNP  fluticasone (FLONASE) 50 MCG/ACT nasal spray Place 1 spray into both nostrils daily. 10/12/21  Yes Elberta Lachapelle, [place] E, FNP  neomycin-polymyxin-hydrocortisone (CORTISPORIN) 3.5-10000-1 OTIC suspension Place 4 drops into the left ear 3 (three) times daily. 12/20/18   Hall-Potvin, [place], PA-C    Family History History reviewed. No pertinent family history.  Social History Social History   Tobacco Use   Smoking status: Every Day     Packs/day: 0.50    Years: 40.00    Total pack years: 20.00    Types: Cigarettes   Smokeless tobacco: Never  Substance Use Topics   Alcohol use: No   Drug use: No     Allergies   Sulfa antibiotics   Review of Systems Review of Systems Per HPI  Physical Exam Triage Vital Signs ED Triage Vitals  Enc Vitals Group     BP 10/12/21 1036 129/80     Pulse Rate 10/12/21 1036 (!) 112     Resp 10/12/21 1036 18     Temp 10/12/21 1036 (!) 102.7 F (39.3 C)     Temp src --      SpO2 10/12/21 1036 94 %     Weight --      Height --      Head Circumference --      Peak Flow --      Pain Score 10/12/21 1035 8     Pain Loc --      Pain Edu? --      Excl. in GC? --    No data found.  Updated Vital Signs BP 129/80   Pulse (!) 112   Temp (!) 101.1 F (38.4 C) (Oral)   Resp 18   SpO2 94%   Visual Acuity Right Eye Distance:   Left Eye Distance:   Bilateral Distance:    Right  Eye Near:   Left Eye Near:    Bilateral Near:     Physical Exam Constitutional:      General: She is not in acute distress.    Appearance: Normal appearance. She is not toxic-appearing or diaphoretic.  HENT:     Head: Normocephalic and atraumatic.     Right Ear: Tympanic membrane and ear canal normal.     Left Ear: Tympanic membrane and ear canal normal.     Nose: Congestion present.     Mouth/Throat:     Mouth: Mucous membranes are moist.     Pharynx: No posterior oropharyngeal erythema.  Eyes:     Extraocular Movements: Extraocular movements intact.     Conjunctiva/sclera: Conjunctivae normal.     Pupils: Pupils are equal, round, and reactive to light.  Cardiovascular:     Rate and Rhythm: Normal rate and regular rhythm.     Pulses: Normal pulses.     Heart sounds: Normal heart sounds.  Pulmonary:     Effort: Pulmonary effort is normal. No respiratory distress.     Breath sounds: No stridor. Rhonchi present. No wheezing or rales.     Comments: Rhonchi left lower lobe. Chest:     Chest  wall: No tenderness.  Abdominal:     General: Abdomen is flat. Bowel sounds are normal.     Palpations: Abdomen is soft.  Musculoskeletal:        General: Normal range of motion.     Cervical back: Normal range of motion.  Skin:    General: Skin is warm and dry.  Neurological:     General: No focal deficit present.     Mental Status: She is alert and oriented to person, place, and time. Mental status is at baseline.  Psychiatric:        Mood and Affect: Mood normal.        Behavior: Behavior normal.      UC Treatments / Results  Labs (all labs ordered are listed, but only abnormal results are displayed) Labs Reviewed  NOVEL CORONAVIRUS, NAA  POCT INFLUENZA A/B    EKG   Radiology DG Chest 2 View  Result Date: 10/12/2021 CLINICAL DATA:  Rhonchi EXAM: CHEST - 2 VIEW COMPARISON:  Chest x-ray dated August 10th 2019 FINDINGS: Cardiac and mediastinal contours are within normal limits. Linear opacities of the right lung base. Indeterminate nodular opacity of the left lower lung. No pleural effusion or pneumothorax. IMPRESSION: 1. Linear opacities of the right lung base, likely due to atelectasis. 2. Indeterminate nodular opacity of the left lower lung, possibly artifactual, although pulmonary nodule can not be excluded. Recommend chest CT for further evaluation, which can be performed non emergently. Electronically Signed   By: Allegra Lai M.D.   On: 10/12/2021 11:10    Procedures Procedures (including critical care time)  Medications Ordered in UC Medications  acetaminophen (TYLENOL) tablet 650 mg (650 mg Oral Given 10/12/21 1041)    Initial Impression / Assessment and Plan / UC Course  I have reviewed the triage vital signs and the nursing notes.  Pertinent labs & imaging results that were available during my care of the patient were reviewed by me and considered in my medical decision making (see chart for details).     Patient presents with symptoms likely from a viral  upper respiratory infection. Differential includes bacterial pneumonia, sinusitis, allergic rhinitis, COVID-19, flu.  Patient is nontoxic appearing and not in need of emergent medical intervention.  Rapid flu  was negative. Covid test was ordered but was not completed by nursing staff prior to patient being discharged.  Chest x-ray showing right lower lobe atelectasis but no signs of pneumonia.  Chest x-ray also showing unidentified nodule in left lower lung base.  Radiologist recommended nonemergent CT scan on an outpatient basis.  PCP appointment was made for patient on August 4 prior to discharge for further evaluation and management of this.  Recommended symptom control with medications.  Will send albuterol inhaler inhaler given chest x-ray findings.  Patient has appointment with PCP in a few days for further evaluation.  Flonase and benzonatate prescribed for patient as well.  Discussed supportive care with patient.  Vital signs and patient breathing stable so do not think that emergent evaluation is necessary.  Return if symptoms fail to improve in 1-2 weeks or you develop shortness of breath, chest pain, severe headache. Patient states understanding and is agreeable.  Discharged with PCP followup.  Final Clinical Impressions(s) / UC Diagnoses   Final diagnoses:  Viral illness  Acute cough  Fever, unspecified     Discharge Instructions      Suspect that you have a viral illness causing your symptoms.  Recommend continuing to monitor fever as we discussed.  You have been sent 3 medications to help alleviate symptoms.  Please follow-up with primary care doctor as soon as possible to have CT scan of your chest given abnormality found on x-ray.  COVID test is pending.     ED Prescriptions     Medication Sig Dispense Auth. Provider   albuterol (VENTOLIN HFA) 108 (90 Base) MCG/ACT inhaler Inhale 1-2 puffs into the lungs every 6 (six) hours as needed for wheezing or shortness of breath. 1  each Hough, Rolly Salter E, FNP   fluticasone [facility]) 50 MCG/ACT nasal spray Place 1 spray into both nostrils daily. 16 g Weston Fulco, Rolly Salter E, [place]   benzonatate (TESSALON) 100 MG capsule Take 1 capsule (100 mg total) by mouth every 8 (eight) hours as needed for cough. 21 capsule [place], Acie Fredrickson, [place]      PDMP not reviewed this encounter.   Gustavus Bryant, [place] 10/12/21 1159

## 2021-10-12 NOTE — Discharge Instructions (Addendum)
Suspect that you have a viral illness causing your symptoms.  Recommend continuing to monitor fever as we discussed.  You have been sent 3 medications to help alleviate symptoms.  Please follow-up with primary care doctor as soon as possible to have CT scan of your chest given abnormality found on x-ray.  COVID test is pending.

## 2021-10-13 ENCOUNTER — Other Ambulatory Visit: Payer: Self-pay | Admitting: Emergency Medicine

## 2021-10-13 ENCOUNTER — Telehealth: Payer: Self-pay | Admitting: Emergency Medicine

## 2021-10-13 MED ORDER — FLUTICASONE PROPIONATE 50 MCG/ACT NA SUSP: 1.0000 | Freq: Every day | NASAL | 0 refills | Status: DC

## 2021-10-13 MED ORDER — BENZONATATE 100 MG PO CAPS: 100.0000 mg | ORAL_CAPSULE | Freq: Three times a day (TID) | ORAL | 0 refills | Status: DC | PRN

## 2021-10-13 NOTE — Telephone Encounter (Signed)
Rx for Flonase and Jennifer Wallace has been resent to patient pharmacy

## 2021-10-14 ENCOUNTER — Ambulatory Visit: Payer: 59 | Admitting: Internal Medicine

## 2022-11-04 ENCOUNTER — Ambulatory Visit (HOSPITAL_COMMUNITY)
Admission: EM | Admit: 2022-11-04 | Discharge: 2022-11-04 | Disposition: A | Discharge status: Home / Self Care | Payer: Self-pay | Attending: Emergency Medicine | Admitting: Emergency Medicine

## 2022-11-04 ENCOUNTER — Encounter (HOSPITAL_COMMUNITY): Payer: Self-pay

## 2022-11-04 DIAGNOSIS — L03213 Periorbital cellulitis: Secondary | ICD-10-CM

## 2022-11-04 MED ORDER — AMOXICILLIN-POT CLAVULANATE 875-125 MG PO TABS: 1.0000 | ORAL_TABLET | Freq: Two times a day (BID) | ORAL | 0 refills | Status: AC

## 2022-11-04 MED ORDER — AMOXICILLIN-POT CLAVULANATE 875-125 MG PO TABS: 1.0000 | ORAL_TABLET | Freq: Two times a day (BID) | ORAL | 0 refills | Status: DC

## 2022-11-04 NOTE — Discharge Instructions (Addendum)
Please take medication as prescribed. Take with food to avoid upset stomach. Finish the full course!! You should not have any pills leftover  Please monitor for improvement in symptoms- it can take about 3 days for the antibiotic to start working If NO improvement after 3 days, or if at any point symptoms get worse, please go directly to the emergency department

## 2022-11-04 NOTE — ED Triage Notes (Signed)
Patient c/o right eye swelling and redness. Swelling and redness extends into the right forehead. Patient reports she has had x 3 days.   Patient reports doing any treatments or taking medication for her symptoms.

## 2022-11-05 NOTE — ED Provider Notes (Signed)
[facility]    CSN: [id number] Arrival date & time: 11/04/22  1721     History   Chief Complaint Chief Complaint  Patient presents with   Facial Swelling    HPI Jennifer Wallace is a 62 y.o. female.  Presents with 3 day history of right upper and lower eyelid swelling, tenderness, redness. Denies fever or loss of vision. No injury or trauma to the eye. Denies foreign body sensation. No discharge or watering. No eye pain or pain with EOM. No medications or interventions yet  Additionally 2 days ago she bumped her right forehead on something and that area has become red. She was applying abx ointment. Area is not painful or itching.   History reviewed. No pertinent past medical history.  There are no problems to display for this patient.   Past Surgical History:  Procedure Laterality Date   CESAREAN SECTION  x2   EYE SURGERY     PARTIAL HYSTERECTOMY     TONSILLECTOMY      OB History   No obstetric history on file.      Home Medications    Prior to Admission medications   Medication Sig Start Date End Date Taking? Authorizing Provider  albuterol (VENTOLIN HFA) 108 (90 Base) MCG/ACT inhaler Inhale 1-2 puffs into the lungs every 6 (six) hours as needed for wheezing or shortness of breath. 10/12/21   Gustavus Bryant, FNP  amoxicillin-clavulanate (AUGMENTIN) 875-125 MG tablet Take 1 tablet by mouth every 12 (twelve) hours for 7 days. 11/04/22 11/11/22  Adrienne Trombetta, Ray Church    Family History History reviewed. No pertinent family history.  Social History Social History   Tobacco Use   Smoking status: Every Day    Current packs/day: 0.50    Average packs/day: 0.5 packs/day for 40.0 years (20.0 ttl pk-yrs)    Types: Cigarettes   Smokeless tobacco: Never  Vaping Use   Vaping status: Never Used  Substance Use Topics   Alcohol use: No   Drug use: No     Allergies   Sulfa antibiotics   Review of Systems Review of Systems As per HPI  Physical  Exam Triage Vital Signs ED Triage Vitals  Encounter Vitals Group     BP 11/04/22 1757 118/78     Systolic BP Percentile --      Diastolic BP Percentile --      Pulse Rate 11/04/22 1757 (!) 101     Resp 11/04/22 1757 16     Temp 11/04/22 1757 98.4 F (36.9 C)     Temp Source 11/04/22 1757 Oral     SpO2 11/04/22 1757 97 %     Weight --      Height --      Head Circumference --      Peak Flow --      Pain Score 11/04/22 1759 1     Pain Loc --      Pain Education --      Exclude from Growth Chart --    No data found.  Updated Vital Signs BP 118/78 (BP Location: Left Arm)   Pulse (!) 101   Temp 98.4 F (36.9 C) (Oral)   Resp 16   SpO2 97%    Physical Exam Vitals and nursing note reviewed.  Constitutional:      General: She is not in acute distress.    Appearance: She is not ill-appearing.  HENT:     Head:      Comments: Small  shallow wound on right forehead. Mild erythema. No drainage or bleeding. No fluctuance or induration. Non tender to touch.  No surrounding erythema or vesicular lesions    Mouth/Throat:     Mouth: Mucous membranes are moist.     Pharynx: Oropharynx is clear.  Eyes:     General: Vision grossly intact.        Right eye: No foreign body or discharge.     Extraocular Movements: Extraocular movements intact.     Right eye: Normal extraocular motion and no nystagmus.     Conjunctiva/sclera: Conjunctivae normal.     Pupils: Pupils are equal, round, and reactive to light.     Comments: Moderate swelling of right upper and lower eyelid. Erythema surrounding. Area is mildly tender. There is no pain with EOM. Pupils equal and reactive, no conjunctival injection or discharge. Vision intact   Cardiovascular:     Rate and Rhythm: Normal rate and regular rhythm.     Heart sounds: Normal heart sounds.  Pulmonary:     Effort: Pulmonary effort is normal.     Breath sounds: Normal breath sounds.  Musculoskeletal:        General: Normal range of motion.      Cervical back: Normal range of motion.  Skin:    General: Skin is warm and dry.  Neurological:     Mental Status: She is alert and oriented to person, place, and time.     UC Treatments / Results  Labs (all labs ordered are listed, but only abnormal results are displayed) Labs Reviewed - No data to display  EKG  Radiology No results found.  Procedures Procedures (including critical care time)  Medications Ordered in UC Medications - No data to display  Initial Impression / Assessment and Plan / UC Course  I have reviewed the triage vital signs and the nursing notes.  Pertinent labs & imaging results that were available during my care of the patient were reviewed by me and considered in my medical decision making (see chart for details).  Preseptal cellulitis of right eye No concern for deeper infection at this time; no pain with EOM, no proptosis, afebrile. Cover with augmentin BID x 7 days. Discussed side effects, importance of finishing course. Discussed strict ED and return precautions if no change after 3 days on the med, or if symptoms worsen at any point. Other symptomatic care if needed.  For the small cut on her head; no sign of infection, she has kept clean. Continue abx ointment BID. May have some coverage from the augmentin.  Patient is agreeable to plan, all questions answered, reiterated strict ED precautions.  Final Clinical Impressions(s) / UC Diagnoses   Final diagnoses:  Preseptal cellulitis of right eye     Discharge Instructions      Please take medication as prescribed. Take with food to avoid upset stomach. Finish the full course!! You should not have any pills leftover  Please monitor for improvement in symptoms- it can take about 3 days for the antibiotic to start working If NO improvement after 3 days, or if at any point symptoms get worse, please go directly to the emergency department     ED Prescriptions     Medication Sig Dispense  Auth. Provider   amoxicillin-clavulanate (AUGMENTIN) 875-125 MG tablet  (Status: Discontinued) Take 1 tablet by mouth every 12 (twelve) hours for 7 days. 14 tablet Krystian Younglove, PA-C   amoxicillin-clavulanate (AUGMENTIN) 875-125 MG tablet Take 1 tablet by mouth every  12 (twelve) hours for 7 days. 14 tablet Sinia Antosh, Lurena Joiner, PA-C      PDMP not reviewed this encounter.   Marlow Baars, [place] 11/05/22 1040

## 2023-02-26 ENCOUNTER — Ambulatory Visit (HOSPITAL_COMMUNITY)
Admission: EM | Admit: 2023-02-26 | Discharge: 2023-02-26 | Disposition: A | Discharge status: Home / Self Care | Payer: Self-pay | Attending: Family Medicine | Admitting: Family Medicine

## 2023-02-26 ENCOUNTER — Encounter (HOSPITAL_COMMUNITY): Payer: Self-pay | Admitting: Emergency Medicine

## 2023-02-26 DIAGNOSIS — J40 Bronchitis, not specified as acute or chronic: Secondary | ICD-10-CM

## 2023-02-26 DIAGNOSIS — R051 Acute cough: Secondary | ICD-10-CM

## 2023-02-26 MED ORDER — AZITHROMYCIN 250 MG PO TABS: 250.0000 mg | ORAL_TABLET | Freq: Every day | ORAL | 0 refills | Status: AC

## 2023-02-26 MED ORDER — PREDNISONE 20 MG PO TABS: 40.0000 mg | ORAL_TABLET | Freq: Every day | ORAL | 0 refills | Status: AC

## 2023-02-26 MED ORDER — HYDROCODONE BIT-HOMATROP MBR 5-1.5 MG/5ML PO SOLN: 5.0000 mL | Freq: Four times a day (QID) | ORAL | 0 refills | Status: AC | PRN

## 2023-02-26 NOTE — ED Triage Notes (Signed)
Pt c/o cough, congestion, and lightheaded since Tuesday.

## 2023-02-26 NOTE — Discharge Instructions (Addendum)
Be aware, your cough medication may cause drowsiness. Please do not drive, operate heavy machinery or make important decisions while on this medication, it can cloud your judgement.  

## 2023-02-28 NOTE — ED Provider Notes (Signed)
[facility]   [id number] 02/26/23 Arrival Time: 1109  ASSESSMENT & PLAN:  1. Acute cough   2. Bronchitis    Discussed typical duration of likely viral illness. Without resp distress. OTC symptom care as needed.  Discharge Medication List as of 02/26/2023  2:20 PM     START taking these medications   Details  azithromycin (ZITHROMAX) 250 MG tablet Take 1 tablet (250 mg total) by mouth daily. Take first 2 tablets together, then 1 every day until finished., Starting Mon 02/26/2023, Normal    HYDROcodone bit-homatropine (HYCODAN) 5-1.5 MG/5ML syrup Take 5 mLs by mouth every 6 (six) hours as needed for cough., Starting Mon 02/26/2023, Normal    predniSONE (DELTASONE) 20 MG tablet Take 2 tablets (40 mg total) by mouth daily., Starting Mon 02/26/2023, Normal         Follow-up Information     [facility].   Specialty: Urgent Care Why: If worsening or failing to improve as anticipated. Contact information: [address] [place] [address] [phone]                Reviewed expectations re: course of current medical issues. Questions answered. Outlined signs and symptoms indicating need for more acute intervention. Understanding verbalized. After Visit Summary given.   SUBJECTIVE: History from: Patient. Jennifer Wallace is a 62 y.o. female. Pt c/o cough, congestion, and lightheaded since Tuesday. Feels she is wheezing. Denies fever/SOB.  Normal PO intake without n/v/d. No tx PTA.  Social History   Tobacco Use  Smoking Status Every Day   Current packs/day: 0.50   Average packs/day: 0.5 packs/day for 40.0 years (20.0 ttl pk-yrs)   Types: Cigarettes  Smokeless Tobacco Never     OBJECTIVE:  Vitals:   02/26/23 1315  BP: 111/73  Pulse: 85  Resp: 17  Temp: 98 F (36.7 C)  TempSrc: Oral  SpO2: 95%    General appearance: alert; no distress Eyes: PERRLA; EOMI; conjunctiva normal HENT: [place]; AT;  with nasal congestion Neck: supple  Lungs: speaks full sentences without difficulty; unlabored; bilat exp wheezing; active cough Extremities: no edema Skin: warm and dry Neurologic: normal gait Psychological: alert and cooperative; normal mood and affect  Labs: Results for orders placed or performed during the hospital encounter of 10/12/21  POCT Influenza A/B   Collection Time: 10/12/21 10:59 AM  Result Value Ref Range   Influenza A, POC Negative Negative   Influenza B, POC Negative Negative   Labs Reviewed - No data to display  Imaging: No results found.  Allergies  Allergen Reactions   Sulfa Antibiotics Anaphylaxis    History reviewed. No pertinent past medical history. Social History   Socioeconomic History   Marital status: Divorced    Spouse name: Not on file   Number of children: Not on file   Years of education: Not on file   Highest education level: Not on file  Occupational History   Not on file  Tobacco Use   Smoking status: Every Day    Current packs/day: 0.50    Average packs/day: 0.5 packs/day for 40.0 years (20.0 ttl pk-yrs)    Types: Cigarettes   Smokeless tobacco: Never  Vaping Use   Vaping status: Never Used  Substance and Sexual Activity   Alcohol use: No   Drug use: No   Sexual activity: Yes    Birth control/protection: Surgical  Other Topics Concern   Not on file  Social History Narrative   Not  on file   Social Drivers of Health   Financial Resource Strain: Not on file  Food Insecurity: Not on file  Transportation Needs: Not on file  Physical Activity: Not on file  Stress: Not on file  Social Connections: Not on file  Intimate Partner Violence: Not on file   History reviewed. No pertinent family history. Past Surgical History:  Procedure Laterality Date   CESAREAN SECTION  x2   EYE SURGERY     PARTIAL HYSTERECTOMY     TONSILLECTOMY       Mardella Layman, MD 02/28/23 [phone]
